# Patient Record
Sex: Female | Born: 1980 | Race: White | Hispanic: No | Marital: Married | Smoking: Former smoker
Health system: Southern US, Community
[De-identification: ages and names within clinical notes are randomized; demographics above are authoritative.]

## PROBLEM LIST (undated history)

## (undated) DIAGNOSIS — G4730 Sleep apnea, unspecified: Secondary | ICD-10-CM

## (undated) DIAGNOSIS — T7840XA Allergy, unspecified, initial encounter: Secondary | ICD-10-CM

## (undated) DIAGNOSIS — K219 Gastro-esophageal reflux disease without esophagitis: Secondary | ICD-10-CM

## (undated) DIAGNOSIS — T8859XA Other complications of anesthesia, initial encounter: Secondary | ICD-10-CM

## (undated) DIAGNOSIS — R011 Cardiac murmur, unspecified: Secondary | ICD-10-CM

## (undated) DIAGNOSIS — G709 Myoneural disorder, unspecified: Secondary | ICD-10-CM

## (undated) DIAGNOSIS — F32A Depression, unspecified: Secondary | ICD-10-CM

## (undated) DIAGNOSIS — F419 Anxiety disorder, unspecified: Secondary | ICD-10-CM

## (undated) DIAGNOSIS — I10 Essential (primary) hypertension: Secondary | ICD-10-CM

## (undated) HISTORY — DX: Cardiac murmur, unspecified: R01.1

## (undated) HISTORY — DX: Anxiety disorder, unspecified: F41.9

## (undated) HISTORY — DX: Allergy, unspecified, initial encounter: T78.40XA

## (undated) HISTORY — DX: Essential (primary) hypertension: I10

## (undated) HISTORY — DX: Myoneural disorder, unspecified: G70.9

## (undated) HISTORY — PX: ABDOMINAL HYSTERECTOMY: SHX81

## (undated) HISTORY — DX: Sleep apnea, unspecified: G47.30

## (undated) HISTORY — DX: Depression, unspecified: F32.A

## (undated) HISTORY — DX: Gastro-esophageal reflux disease without esophagitis: K21.9

---

## 2014-03-11 DIAGNOSIS — R011 Cardiac murmur, unspecified: Secondary | ICD-10-CM | POA: Insufficient documentation

## 2014-03-11 DIAGNOSIS — Z720 Tobacco use: Secondary | ICD-10-CM | POA: Insufficient documentation

## 2015-05-22 DIAGNOSIS — F3281 Premenstrual dysphoric disorder: Secondary | ICD-10-CM | POA: Insufficient documentation

## 2015-10-26 DIAGNOSIS — F321 Major depressive disorder, single episode, moderate: Secondary | ICD-10-CM | POA: Insufficient documentation

## 2015-11-24 DIAGNOSIS — M26609 Unspecified temporomandibular joint disorder, unspecified side: Secondary | ICD-10-CM | POA: Insufficient documentation

## 2016-06-20 DIAGNOSIS — D259 Leiomyoma of uterus, unspecified: Secondary | ICD-10-CM | POA: Insufficient documentation

## 2016-08-30 DIAGNOSIS — Z90710 Acquired absence of both cervix and uterus: Secondary | ICD-10-CM | POA: Insufficient documentation

## 2016-09-22 DIAGNOSIS — M79601 Pain in right arm: Secondary | ICD-10-CM | POA: Insufficient documentation

## 2018-02-03 DIAGNOSIS — E559 Vitamin D deficiency, unspecified: Secondary | ICD-10-CM | POA: Insufficient documentation

## 2018-02-03 DIAGNOSIS — Z0000 Encounter for general adult medical examination without abnormal findings: Secondary | ICD-10-CM | POA: Insufficient documentation

## 2019-11-11 DIAGNOSIS — R0789 Other chest pain: Secondary | ICD-10-CM | POA: Insufficient documentation

## 2020-03-21 DIAGNOSIS — F4001 Agoraphobia with panic disorder: Secondary | ICD-10-CM | POA: Insufficient documentation

## 2020-03-21 HISTORY — DX: Agoraphobia with panic disorder: F40.01

## 2020-11-24 ENCOUNTER — Ambulatory Visit: Payer: Self-pay | Admitting: Internal Medicine

## 2021-01-01 ENCOUNTER — Telehealth: Payer: Self-pay

## 2021-01-01 NOTE — Telephone Encounter (Signed)
Copied from [place] [id number]. Topic: General - Other >> Jan 01, 2021 10:08 AM Leward Quan A wrote: Reason for CRM: Patient called in to say that she was seen at ER due to severe pain say that she was informed to see her PCP in a few days. She have not yet been established and need some directions on what to do. Scheduled to establish care with [facility] on 01/21/21. Please call patient with some suggestion on what to do Ph# [phone]   The patient and her wife was informed that we do not have any sooner new patient appointments. The wife was very upset stating that the patient was in severe pain and have not had a bowel movement in 5 days. I recommended that she follow back up at the hospital if she is in severe pain. The wife stated she has been to the hospital and Urgent Care and nothing was done. I informed her that unfortunately there is nothing I can do at this time because she is not technically a patient of [facility]. I advise her again to seek emergent care if she is in severe pain.

## 2021-01-21 ENCOUNTER — Encounter: Payer: Self-pay | Admitting: Internal Medicine

## 2021-01-21 ENCOUNTER — Ambulatory Visit (INDEPENDENT_AMBULATORY_CARE_PROVIDER_SITE_OTHER): Payer: 59 | Admitting: Internal Medicine

## 2021-01-21 ENCOUNTER — Other Ambulatory Visit: Payer: Self-pay

## 2021-01-21 VITALS — BP 153/101 | HR 72 | Temp 97.1°F | Resp 18 | Ht 62.0 in | Wt 183.6 lb

## 2021-01-21 DIAGNOSIS — K5909 Other constipation: Secondary | ICD-10-CM

## 2021-01-21 DIAGNOSIS — L309 Dermatitis, unspecified: Secondary | ICD-10-CM | POA: Insufficient documentation

## 2021-01-21 DIAGNOSIS — K219 Gastro-esophageal reflux disease without esophagitis: Secondary | ICD-10-CM

## 2021-01-21 DIAGNOSIS — I10 Essential (primary) hypertension: Secondary | ICD-10-CM | POA: Diagnosis not present

## 2021-01-21 DIAGNOSIS — F900 Attention-deficit hyperactivity disorder, predominantly inattentive type: Secondary | ICD-10-CM | POA: Diagnosis not present

## 2021-01-21 DIAGNOSIS — L308 Other specified dermatitis: Secondary | ICD-10-CM

## 2021-01-21 DIAGNOSIS — Z6836 Body mass index (BMI) 36.0-36.9, adult: Secondary | ICD-10-CM | POA: Insufficient documentation

## 2021-01-21 DIAGNOSIS — F909 Attention-deficit hyperactivity disorder, unspecified type: Secondary | ICD-10-CM | POA: Insufficient documentation

## 2021-01-21 DIAGNOSIS — Z6833 Body mass index (BMI) 33.0-33.9, adult: Secondary | ICD-10-CM

## 2021-01-21 DIAGNOSIS — E6609 Other obesity due to excess calories: Secondary | ICD-10-CM

## 2021-01-21 DIAGNOSIS — M797 Fibromyalgia: Secondary | ICD-10-CM

## 2021-01-21 DIAGNOSIS — F32A Depression, unspecified: Secondary | ICD-10-CM

## 2021-01-21 DIAGNOSIS — F419 Anxiety disorder, unspecified: Secondary | ICD-10-CM | POA: Diagnosis not present

## 2021-01-21 DIAGNOSIS — F4310 Post-traumatic stress disorder, unspecified: Secondary | ICD-10-CM

## 2021-01-21 MED ORDER — OLMESARTAN MEDOXOMIL 20 MG PO TABS: 20.0000 mg | ORAL_TABLET | Freq: Every day | ORAL | 0 refills | Status: DC

## 2021-01-21 MED ORDER — CYCLOBENZAPRINE HCL 5 MG PO TABS: 5.0000 mg | ORAL_TABLET | Freq: Every evening | ORAL | 0 refills | Status: DC | PRN

## 2021-01-21 MED ORDER — TRIAMCINOLONE ACETONIDE 0.1 % EX CREA: 1.0000 "application " | TOPICAL_CREAM | Freq: Two times a day (BID) | CUTANEOUS | 0 refills | Status: DC

## 2021-01-21 MED ORDER — OMEPRAZOLE 20 MG PO CPDR: 20.0000 mg | DELAYED_RELEASE_CAPSULE | Freq: Every day | ORAL | 1 refills | Status: DC

## 2021-01-21 NOTE — Assessment & Plan Note (Signed)
Encourage diet and exercise for weight loss 

## 2021-01-21 NOTE — Assessment & Plan Note (Signed)
Managed on Fluvoxamine, Rexulti and Prazosin per psychiatry Will follow

## 2021-01-21 NOTE — Assessment & Plan Note (Signed)
Encouraged high-fiber diet and adequate water intake Referral to GI for further evaluation of symptoms

## 2021-01-21 NOTE — Assessment & Plan Note (Signed)
We will trial Omeprazole 20 mg daily Encouraged weight loss as this can help reduce reflux symptoms

## 2021-01-21 NOTE — Assessment & Plan Note (Signed)
Managed on Strattera but this is in the process of being changed by her psychiatrist Will follow

## 2021-01-21 NOTE — Patient Instructions (Signed)

## 2021-01-21 NOTE — Assessment & Plan Note (Signed)
Rx for triamcinolone cream 0.1% twice daily as needed

## 2021-01-21 NOTE — Assessment & Plan Note (Signed)
Rx for Olmesartan 20 mg daily Reinforced DASH diet and exercise for weight loss We will check c-Met at annual exam

## 2021-01-21 NOTE — Progress Notes (Signed)
HPI  Patient presents the clinic today to establish care and for management of the conditions listed below.  HTN: She has failed Lisinopril, Losartan and Amlodipine. She denies frequent headaches, dizziness or chest pain.  ADHD: Managed on Straterra.  She reports this medicine has not been effective recently and is in the process of being changed.  She follows with psychiatrist.  Anxiety, Depression, PTSD, and Nightmares: Chronic, managed on Prazosin, Fluvoxamine and Rexulti.  She is currently seeing a therapist.  She is seeing a psychiatrist also.  She denies SI/HI.  Chronic Constipation: Managed on Mirilax. She reports recent changes in medication but not in diet.  She has had a recent urgent care and ER visit for the same.  She has not seen a gastroenterologist but would like a referral to them.Marland Kitchen   GERD: Triggered by spicy and greasy foods, stress. This occurs daily. She takes Tums as needed with some relief of symptoms. She has never had an upper endoscopy.  Fibromyalgia: She reports SI joint, piriformis, rhomboids. She is not currently taking any thing for this. She does get massages with some relief. She does stretch but is not lifting light weights.  Eczema: She is using Eucerin as needed with some relief of symptoms.    Past Medical History:  Diagnosis Date   Anxiety    Chronic kidney disease    Depression    GERD (gastroesophageal reflux disease)    Hypertension     Current Outpatient Medications  Medication Sig Dispense Refill   atomoxetine (STRATTERA) 40 MG capsule Take 40 mg by mouth every morning.     Brexpiprazole (REXULTI) 0.5 MG TABS Take 1 tablet by mouth daily.     clonazePAM (KLONOPIN) 0.5 MG tablet Take 0.5 mg by mouth daily as needed.     fluvoxaMINE (LUVOX) 100 MG tablet Take 100 mg by mouth 2 (two) times daily.     prazosin (MINIPRESS) 5 MG capsule Take 5 mg by mouth at bedtime.     No current facility-administered medications for this visit.    Allergies   Allergen Reactions   Bee Venom Rash   Lactose Diarrhea    Gas    Prednisone     Other reaction(s): Unknown   Sertraline Diarrhea    Other reaction(s): Other (See Comments), Other (See Comments) Emotional numbness Emotional numbness    Lisinopril Cough and Other (See Comments)    cough cough     No family history on file.  Social History   Socioeconomic History   Marital status: Married    Spouse name: Not on file   Number of children: Not on file   Years of education: Not on file   Highest education level: Not on file  Occupational History   Not on file  Tobacco Use   Smoking status: Not on file   Smokeless tobacco: Not on file  Substance and Sexual Activity   Alcohol use: Yes   Drug use: Never   Sexual activity: Not on file  Other Topics Concern   Not on file  Social History Narrative   Not on file   Social Determinants of Health   Financial Resource Strain: Not on file  Food Insecurity: Not on file  Transportation Needs: Not on file  Physical Activity: Not on file  Stress: Not on file  Social Connections: Not on file  Intimate Partner Violence: Not on file    ROS:  Constitutional: Denies fever, malaise, fatigue, headache or abrupt weight changes.  HEENT:  Denies eye pain, eye redness, ear pain, ringing in the ears, wax buildup, runny nose, nasal congestion, bloody nose, or sore throat. Respiratory: Denies difficulty breathing, shortness of breath, cough or sputum production.   Cardiovascular: Denies chest pain, chest tightness, palpitations or swelling in the hands or feet.  Gastrointestinal: Patient reports constipation.  Denies abdominal pain, bloating, diarrhea or blood in the stool.  GU: Denies frequency, urgency, pain with urination, blood in urine, odor or discharge. Musculoskeletal: Denies decrease in range of motion, difficulty with gait, muscle pain or joint pain and swelling.  Skin: Patient reports eczema.  Denies redness, lesions or  ulcercations.  Neurological: Patient reports inattention.  Denies dizziness, difficulty with memory, difficulty with speech or problems with balance and coordination.  Psych: Patient has a history of anxiety and depression.  Denies SI/HI.  No other specific complaints in a complete review of systems (except as listed in HPI above).  PE:  BP (!) 153/101 (BP Location: Right Arm, Patient Position: Sitting, Cuff Size: Normal)   Pulse 72   Temp (!) 97.1 F (36.2 C) (Temporal)   Resp 18   Ht 5\' 2"  (1.575 m)   Wt 183 lb 9.6 oz (83.3 kg)   SpO2 99%   BMI 33.58 kg/m  Wt Readings from Last 3 Encounters:  01/21/21 183 lb 9.6 oz (83.3 kg)    General: Appears her stated age, obese, in NAD. Skin: Patches of eczema noted of left medial thigh, right posterior knee HEENT: Head: normal shape and size; Eyes: sclera white and EOMs intact;  Neck: Neck supple, trachea midline. No masses, lumps or thyromegaly present.  Cardiovascular: Normal rate and rhythm. S1,S2 noted.  No murmur, rubs or gallops noted. No JVD or BLE edema.  Pulmonary/Chest: Normal effort and positive vesicular breath sounds. No respiratory distress. No wheezes, rales or ronchi noted.  Abdomen: Soft and nontender.  Hypoactive bowel sounds, no bruits noted. No distention or masses noted. Liver, spleen and kidneys non palpable. Musculoskeletal: Multiple tender spots noted of the paraspinal muscles of the cervical, thoracic and lumbar region no difficulty with gait.  Neurological: Alert and oriented.  Psychiatric: Mood and affect normal.  Mildly anxious appearing.  Judgment and thought content normal.     Assessment and Plan:  Webb Silversmith, NP This visit occurred during the SARS-CoV-2 public health emergency.  Safety protocols were in place, including screening questions prior to the visit, additional usage of staff PPE, and extensive cleaning of exam room while observing appropriate contact time as indicated for disinfecting  solutions.

## 2021-01-21 NOTE — Assessment & Plan Note (Signed)
Encouraged daily stretching and light weight training Rx for Flexeril 5 mg nightly as needed for flares-sedation caution given

## 2021-01-25 ENCOUNTER — Encounter: Payer: Self-pay | Admitting: *Deleted

## 2021-02-04 ENCOUNTER — Other Ambulatory Visit: Payer: Self-pay

## 2021-02-04 ENCOUNTER — Encounter: Payer: Self-pay | Admitting: Internal Medicine

## 2021-02-04 ENCOUNTER — Ambulatory Visit (INDEPENDENT_AMBULATORY_CARE_PROVIDER_SITE_OTHER): Payer: 59 | Admitting: Internal Medicine

## 2021-02-04 ENCOUNTER — Ambulatory Visit
Admission: RE | Admit: 2021-02-04 | Discharge: 2021-02-04 | Disposition: A | Discharge status: Home / Self Care | Payer: 59 | Attending: Internal Medicine | Admitting: Internal Medicine

## 2021-02-04 ENCOUNTER — Ambulatory Visit
Admission: RE | Admit: 2021-02-04 | Discharge: 2021-02-04 | Disposition: A | Discharge status: Home / Self Care | Payer: 59 | Source: Ambulatory Visit | Attending: Internal Medicine | Admitting: Internal Medicine

## 2021-02-04 VITALS — BP 118/78 | HR 82 | Temp 97.1°F | Resp 17 | Ht 62.0 in | Wt 192.2 lb

## 2021-02-04 DIAGNOSIS — M545 Low back pain, unspecified: Secondary | ICD-10-CM

## 2021-02-04 DIAGNOSIS — I10 Essential (primary) hypertension: Secondary | ICD-10-CM | POA: Diagnosis not present

## 2021-02-04 DIAGNOSIS — N62 Hypertrophy of breast: Secondary | ICD-10-CM

## 2021-02-04 DIAGNOSIS — G8929 Other chronic pain: Secondary | ICD-10-CM | POA: Diagnosis present

## 2021-02-04 DIAGNOSIS — K219 Gastro-esophageal reflux disease without esophagitis: Secondary | ICD-10-CM

## 2021-02-04 DIAGNOSIS — Z6835 Body mass index (BMI) 35.0-35.9, adult: Secondary | ICD-10-CM

## 2021-02-04 DIAGNOSIS — M546 Pain in thoracic spine: Secondary | ICD-10-CM

## 2021-02-04 MED ORDER — OLMESARTAN MEDOXOMIL 20 MG PO TABS: 20.0000 mg | ORAL_TABLET | Freq: Every day | ORAL | 1 refills | Status: DC

## 2021-02-04 NOTE — Patient Instructions (Signed)
Back Exercises The following exercises strengthen the muscles that help to support the trunk and back. They also help to keep the lower back flexible. Doing these exercises can help to prevent back pain or lessen existing pain. If you have back pain or discomfort, try doing these exercises 2-3 times each day or as told by your health care provider. As your pain improves, do them once each day, but increase the number of times that you repeat the steps for each exercise (do more repetitions). To prevent the recurrence of back pain, continue to do these exercises once each day or as told by your health care provider. Do exercises exactly as told by your health care provider and adjust them as directed. It is normal to feel mild stretching, pulling, tightness, or discomfort as you do these exercises, but you should stop right away if youfeel sudden pain or your pain gets worse. Exercises Single knee to chest Repeat these steps 3-5 times for each leg: Lie on your back on a firm bed or the floor with your legs extended. Bring one knee to your chest. Your other leg should stay extended and in contact with the floor. Hold your knee in place by grabbing your knee or thigh with both hands and hold. Pull on your knee until you feel a gentle stretch in your lower back or buttocks. Hold the stretch for 10-30 seconds. Slowly release and straighten your leg. Pelvic tilt Repeat these steps 5-10 times: Lie on your back on a firm bed or the floor with your legs extended. Bend your knees so they are pointing toward the ceiling and your feet are flat on the floor. Tighten your lower abdominal muscles to press your lower back against the floor. This motion will tilt your pelvis so your tailbone points up toward the ceiling instead of pointing to your feet or the floor. With gentle tension and even breathing, hold this position for 5-10 seconds. Cat-cow Repeat these steps until your lower back becomes more  flexible: Get into a hands-and-knees position on a firm surface. Keep your hands under your shoulders, and keep your knees under your hips. You may place padding under your knees for comfort. Let your head hang down toward your chest. Contract your abdominal muscles and point your tailbone toward the floor so your lower back becomes rounded like the back of a cat. Hold this position for 5 seconds. Slowly lift your head, let your abdominal muscles relax and point your tailbone up toward the ceiling so your back forms a sagging arch like the back of a cow. Hold this position for 5 seconds.  Press-ups Repeat these steps 5-10 times: Lie on your abdomen (face-down) on the floor. Place your palms near your head, about shoulder-width apart. Keeping your back as relaxed as possible and keeping your hips on the floor, slowly straighten your arms to raise the top half of your body and lift your shoulders. Do not use your back muscles to raise your upper torso. You may adjust the placement of your hands to make yourself more comfortable. Hold this position for 5 seconds while you keep your back relaxed. Slowly return to lying flat on the floor.  Bridges Repeat these steps 10 times: Lie on your back on a firm surface. Bend your knees so they are pointing toward the ceiling and your feet are flat on the floor. Your arms should be flat at your sides, next to your body. Tighten your buttocks muscles and lift your   buttocks off the floor until your waist is at almost the same height as your knees. You should feel the muscles working in your buttocks and the back of your thighs. If you do not feel these muscles, slide your feet 1-2 inches farther away from your buttocks. Hold this position for 3-5 seconds. Slowly lower your hips to the starting position, and allow your buttocks muscles to relax completely. If this exercise is too easy, try doing it with your arms crossed over yourchest. Abdominal  crunches Repeat these steps 5-10 times: Lie on your back on a firm bed or the floor with your legs extended. Bend your knees so they are pointing toward the ceiling and your feet are flat on the floor. Cross your arms over your chest. Tip your chin slightly toward your chest without bending your neck. Tighten your abdominal muscles and slowly raise your trunk (torso) high enough to lift your shoulder blades a tiny bit off the floor. Avoid raising your torso higher than that because it can put too much stress on your low back and does not help to strengthen your abdominal muscles. Slowly return to your starting position. Back lifts Repeat these steps 5-10 times: Lie on your abdomen (face-down) with your arms at your sides, and rest your forehead on the floor. Tighten the muscles in your legs and your buttocks. Slowly lift your chest off the floor while you keep your hips pressed to the floor. Keep the back of your head in line with the curve in your back. Your eyes should be looking at the floor. Hold this position for 3-5 seconds. Slowly return to your starting position. Contact a health care provider if: Your back pain or discomfort gets much worse when you do an exercise. Your worsening back pain or discomfort does not lessen within 2 hours after you exercise. If you have any of these problems, stop doing these exercises right away. Do not do them again unless your health care provider says that you can. Get help right away if: You develop sudden, severe back pain. If this happens, stop doing the exercises right away. Do not do them again unless your health care provider says that you can. This information is not intended to replace advice given to you by your health care provider. Make sure you discuss any questions you have with your healthcare provider. Document Revised: 11/08/2018 Document Reviewed: 04/05/2018 Elsevier Patient Education  [place].

## 2021-02-04 NOTE — Progress Notes (Signed)
Subjective:    Patient ID: Sara Andrade, female    DOB: 04/15/1981, 40 y.o.   MRN: [id number]  HPI  Pt presents to the clinic today for follow up of HTN. At her last visit, she was started on Olmesartan.  She has been taking the medication as prescribed and denies adverse side effects.  Her BP today is 118/78.  There is no ECG on file.  She was also started on Omeprazole for reflux.  She reports the reflux still persists intermittently but overall is much better on the Omeprazole.  She also reports chronic thoracic back pain.  She reports this has been going on for many years.  She attributes this to her large breasts which are currently a size 36 GGG.  She reports indentions in her shoulders from her bra.  She reports chronic rash underneath her bilateral breasts.  She has considered getting a breast reduction but is not quite ready for this at this time.  She takes Flexeril as needed with some relief of symptoms.  She also reports chronic low back pain.  She describes the pain as achy and stiff.  The pain radiates around into her bilateral hip/groins.  She denies numbness, tingling or weakness of her lower extremities.  She denies loss of bowel or bladder control.  She takes Flexeril as needed with some relief of symptoms.  Review of Systems  Past Medical History:  Diagnosis Date   Anxiety    Depression    GERD (gastroesophageal reflux disease)    Hypertension     Current Outpatient Medications  Medication Sig Dispense Refill   Brexpiprazole (REXULTI) 0.5 MG TABS Take 1 tablet by mouth daily.     clonazePAM (KLONOPIN) 0.5 MG tablet Take 0.5 mg by mouth daily as needed.     cyclobenzaprine (FLEXERIL) 5 MG tablet Take 1 tablet (5 mg total) by mouth at bedtime as needed for muscle spasms. 20 tablet 0   fluvoxaMINE (LUVOX) 100 MG tablet Take 100 mg by mouth 2 (two) times daily.     olmesartan (BENICAR) 20 MG tablet Take 1 tablet (20 mg total) by mouth daily. 30 tablet 0   omeprazole  (PRILOSEC) 20 MG capsule Take 1 capsule (20 mg total) by mouth daily. 90 capsule 1   prazosin (MINIPRESS) 5 MG capsule Take 5 mg by mouth at bedtime.     triamcinolone cream (KENALOG) 0.1 % Apply 1 application topically 2 (two) times daily. 60 g 0   atomoxetine (STRATTERA) 40 MG capsule Take 40 mg by mouth every morning. (Patient not taking: Reported on 02/04/2021)     No current facility-administered medications for this visit.    Allergies  Allergen Reactions   Bee Venom Rash   Lactose Diarrhea    Gas    Prednisone     Other reaction(s): Unknown   Sertraline Diarrhea    Other reaction(s): Other (See Comments), Other (See Comments) Emotional numbness Emotional numbness    Lisinopril Cough and Other (See Comments)    cough cough     No family history on file.  Social History   Socioeconomic History   Marital status: Married    Spouse name: Not on file   Number of children: Not on file   Years of education: Not on file   Highest education level: Not on file  Occupational History   Not on file  Tobacco Use   Smoking status: Former    Types: Cigarettes    Quit date: 2012  Years since quitting: 10.5   Smokeless tobacco: Never  Vaping Use   Vaping Use: Never used  Substance and Sexual Activity   Alcohol use: Yes   Drug use: Never   Sexual activity: Not on file  Other Topics Concern   Not on file  Social History Narrative   Not on file   Social Determinants of Health   Financial Resource Strain: Not on file  Food Insecurity: Not on file  Transportation Needs: Not on file  Physical Activity: Not on file  Stress: Not on file  Social Connections: Not on file  Intimate Partner Violence: Not on file     Constitutional: Denies fever, malaise, fatigue, headache or abrupt weight changes.  Respiratory: Denies difficulty breathing, shortness of breath, cough or sputum production.   Cardiovascular: Denies chest pain, chest tightness, palpitations or swelling in  the hands or feet.  Gastrointestinal: Denies abdominal pain, bloating, constipation, diarrhea or blood in the stool.  GU: Denies urgency, frequency, pain with urination, burning sensation, blood in urine, odor or discharge. Musculoskeletal: Patient reports chronic thoracic and lumbar back pain.  Denies decrease in range of motion, difficulty with gait, or joint swelling.  Skin: Denies redness, rashes, lesions or ulcercations.  Neurological: Denies numbness, tingling, weakness or problems with balance and coordination.    No other specific complaints in a complete review of systems (except as listed in HPI above).     Objective:   Physical Exam  BP 118/78 (BP Location: Right Arm, Patient Position: Sitting, Cuff Size: Normal)   Pulse 82   Temp (!) 97.1 F (36.2 C) (Temporal)   Resp 17   Ht 5\' 2"  (1.575 m)   Wt 192 lb 3.2 oz (87.2 kg)   SpO2 99%   BMI 35.15 kg/m  Wt Readings from Last 3 Encounters:  02/04/21 192 lb 3.2 oz (87.2 kg)  01/21/21 183 lb 9.6 oz (83.3 kg)    General: Appears her stated age, obese, in NAD. Skin: Warm, dry and intact.  Large breasts noted.  Intention noted in bilateral shoulders from bra strap. HEENT: Head: normal shape and size; Eyes: sclera white and EOMs intact;  Cardiovascular: Normal rate and rhythm. S1,S2 noted.  No murmur, rubs or gallops noted. No JVD or BLE edema.  Pulmonary/Chest: Normal effort and positive vesicular breath sounds. No respiratory distress. No wheezes, rales or ronchi noted.  Musculoskeletal: Bony tenderness noted over the thoracic and lumbar spine.  No difficulty with gait.  Neurological: Alert and oriented.       Assessment & Plan:    Webb Silversmith, NP This visit occurred during the SARS-CoV-2 public health emergency.  Safety protocols were in place, including screening questions prior to the visit, additional usage of staff PPE, and extensive cleaning of exam room while observing appropriate contact time as indicated for  disinfecting solutions.

## 2021-02-04 NOTE — Assessment & Plan Note (Signed)
Controlled on Olmesartan, refilled today Reinforced DASH diet and exercise for weight loss Kidney function reviewed

## 2021-02-04 NOTE — Assessment & Plan Note (Addendum)
X-ray thoracic spine today Encouraged daily stretching Discussed increasing Flexeril to 10 mg daily but she thinks this would be too sedating

## 2021-02-04 NOTE — Assessment & Plan Note (Signed)
X-ray lumbar spine today Encouraged daily stretching Discussed increasing Flexeril to 10 mg daily but she thinks this would be too sedating

## 2021-02-04 NOTE — Assessment & Plan Note (Signed)
Continue Omeprazole Try to avoid foods that trigger your reflux Encourage weight loss as this can produce reflux symptoms

## 2021-02-04 NOTE — Assessment & Plan Note (Signed)
X-ray thoracic spine today Would consider referral for breast reduction

## 2021-02-04 NOTE — Assessment & Plan Note (Signed)
Encourage diet and exercise weight loss 

## 2021-03-05 ENCOUNTER — Other Ambulatory Visit: Payer: Self-pay | Admitting: Internal Medicine

## 2021-03-05 NOTE — Telephone Encounter (Signed)
Requested medications are due for refill today yes  Requested medications are on the active medication list yes  Last refill 7/7  Last visit 7/21  Future visit scheduled 07/28/21  Notes to clinic Not Delegated.

## 2021-04-14 ENCOUNTER — Ambulatory Visit: Payer: 59 | Admitting: Internal Medicine

## 2021-04-20 ENCOUNTER — Other Ambulatory Visit: Payer: Self-pay

## 2021-04-21 ENCOUNTER — Other Ambulatory Visit: Payer: Self-pay

## 2021-04-21 ENCOUNTER — Encounter: Payer: Self-pay | Admitting: Gastroenterology

## 2021-04-21 ENCOUNTER — Ambulatory Visit (INDEPENDENT_AMBULATORY_CARE_PROVIDER_SITE_OTHER): Payer: 59 | Admitting: Gastroenterology

## 2021-04-21 VITALS — BP 162/117 | HR 75 | Temp 98.4°F | Ht 62.0 in | Wt 193.6 lb

## 2021-04-21 DIAGNOSIS — K625 Hemorrhage of anus and rectum: Secondary | ICD-10-CM

## 2021-04-21 DIAGNOSIS — K581 Irritable bowel syndrome with constipation: Secondary | ICD-10-CM | POA: Diagnosis not present

## 2021-04-21 DIAGNOSIS — L290 Pruritus ani: Secondary | ICD-10-CM | POA: Diagnosis not present

## 2021-04-21 MED ORDER — NA SULFATE-K SULFATE-MG SULF 17.5-3.13-1.6 GM/177ML PO SOLN: 354.0000 mL | Freq: Once | ORAL | 0 refills | Status: AC

## 2021-04-21 NOTE — Patient Instructions (Signed)

## 2021-04-21 NOTE — Progress Notes (Signed)
Jonathon Bellows MD, MRCP(U.K) [address]  [place]  [place], [place] [address]  Main: [phone]  Fax: [phone]   Gastroenterology Consultation  Referring Provider:     Jearld Fenton, NP Primary Care Physician:  Jearld Fenton, NP Primary Gastroenterologist:  Dr. Jonathon Bellows  Reason for Consultation:     Chronic constipation  HPI:   Sara Andrade is a 40 y.o. y/o female referred for consultation & management  by  Jearld Fenton, NP.   Seen in the ER on 01/09/2021 for constipation she has history of mitral valve prolapse, endometriosis.  She underwent a CT scan of the abdomen and pelvis without evidence of any abnormalities was given a gallon of GoLytely and sent home. 01/09/2021: Hemoglobin 13.5 g, CMP normal  She states that she has had issues with constipation for many years gradually got worse.  She has abdominal pain in the left side cramping in nature almost daily which is resolved after bowel movement.  She has a bowel movement daily but does not feel like she has evacuated her entire bowel contents.  Has not tried over-the-counter medications.  Diet not rich in fiber.  Has had some blood on the tissue paper over the years.  Some perianal itching.  No family history available since she is adopted.  No prior colonoscopy.  Past Medical History:  Diagnosis Date   Anxiety    Depression    GERD (gastroesophageal reflux disease)    Hypertension     Past Surgical History:  Procedure Laterality Date   ABDOMINAL HYSTERECTOMY      Prior to Admission medications   Medication Sig Start Date End Date Taking? Authorizing Provider  CONCERTA 27 MG CR tablet Take 27 mg by mouth every morning. 04/15/21  Yes [provider]  fluvoxaMINE (LUVOX) 100 MG tablet Take 100 mg by mouth 2 (two) times daily. 12/15/20  Yes [provider]  olmesartan (BENICAR) 20 MG tablet Take 1 tablet (20 mg total) by mouth daily. 02/04/21  Yes Jearld Fenton, NP  omeprazole  (PRILOSEC) 20 MG capsule Take 1 capsule (20 mg total) by mouth daily. 01/21/21  Yes Baity, Coralie Keens, NP  prazosin (MINIPRESS) 5 MG capsule Take 5 mg by mouth at bedtime. 01/14/21  Yes [provider]  REXULTI 1 MG TABS tablet Take 1 mg by mouth daily. 03/24/21  Yes [provider]  clonazePAM (KLONOPIN) 0.5 MG tablet Take 0.5 mg by mouth daily as needed. Patient not taking: Reported on 04/21/2021 01/14/21   [provider]  cyclobenzaprine (FLEXERIL) 5 MG tablet TAKE ONE TABLET BY MOUTH EVERY NIGHT AT BEDTIME AS NEEDED FOR MUSCLE SPASMS Patient not taking: Reported on 04/21/2021 03/08/21   Jearld Fenton, NP  triamcinolone cream (KENALOG) 0.1 % Apply 1 application topically 2 (two) times daily. Patient not taking: Reported on 04/21/2021 01/21/21   Jearld Fenton, NP    No family history on file.   Social History   Tobacco Use   Smoking status: Former    Types: Cigarettes    Quit date: 2012    Years since quitting: 10.7   Smokeless tobacco: Never  Vaping Use   Vaping Use: Never used  Substance Use Topics   Alcohol use: Yes   Drug use: Never    Allergies as of 04/21/2021 - Review Complete 02/04/2021  Allergen Reaction Noted   Bee venom Rash 01/21/2021   Lactose Diarrhea 06/04/2015   Prednisone  12/25/2020   Sertraline  Diarrhea 08/25/2015   Lisinopril Cough and Other (See Comments) 05/22/2015    Review of Systems:    All systems reviewed and negative except where noted in HPI.   Physical Exam:  BP (!) 162/117   Pulse 75   Temp 98.4 F (36.9 C) (Oral)   Ht 5\' 2"  (1.575 m)   Wt 193 lb 9.6 oz (87.8 kg)   BMI 35.41 kg/m  No LMP recorded. Patient has had a hysterectomy. Psych:  Alert and cooperative. Normal mood and affect. General:   Alert,  Well-developed, well-nourished, pleasant and cooperative in NAD Head:  Normocephalic and atraumatic. Eyes:  Sclera clear, no icterus.   Conjunctiva pink. Ears:  Normal auditory acuity. Lungs:  Respirations even and  unlabored.  Clear throughout to auscultation.   No wheezes, crackles, or rhonchi. No acute distress. Heart:  Regular rate and rhythm; no murmurs, clicks, rubs, or gallops. Abdomen:  Normal bowel sounds.  No bruits.  Soft, non-tender and non-distended without masses, hepatosplenomegaly or hernias noted.  No guarding or rebound tenderness.    Neurologic:  Alert and oriented x3;  grossly normal neurologically. Psych:  Alert and cooperative. Normal mood and affect.  Imaging Studies: No results found.  Assessment and Plan:   [name] is a 40 y.o. y/o female has been referred for chronic constipation.  History suggestive of IBS constipation.  Rectal bleeding likely secondary to constipation.  History of endometriosis.  Plan 1.  High-fiber diet counseled about aim of 25 g of fiber per day.  Provided [facility] information on high-fiber diet. 2.  Internal hemorrhoids likely which will be confirmed on colonoscopy.  Discussed about conservative management.  Patient information provided.  If it does not work at next visit we will consider banding of internal hemorrhoids. 3.  Colonoscopy to evaluate for rectal bleeding likely due to internal hemorrhoids.  We will also rule out any colonic endometriomas at the same time. 4.  Constipation high-fiber diet we will add Trulance if needed.  Samples provided.  I have discussed alternative options, risks & benefits,  which include, but are not limited to, bleeding, infection, perforation,respiratory complication & drug reaction.  The patient agrees with this plan & written consent will be obtained.     Follow up in 8-12 weeks   Dr Jonathon Bellows MD,MRCP(U.K)

## 2021-04-27 ENCOUNTER — Telehealth: Payer: Self-pay

## 2021-04-27 NOTE — Telephone Encounter (Signed)
Procedure has been cancelled. Endo unit notified.

## 2021-04-27 NOTE — Telephone Encounter (Signed)
Pt. Calling to cancel colonoscopy she is showing covid symptoms

## 2021-04-28 ENCOUNTER — Encounter: Admission: RE | Payer: Self-pay | Source: Home / Self Care

## 2021-04-28 ENCOUNTER — Ambulatory Visit: Admission: RE | Admit: 2021-04-28 | Payer: 59 | Source: Home / Self Care | Admitting: Gastroenterology

## 2021-04-28 SURGERY — COLONOSCOPY
Anesthesia: General

## 2021-06-22 DIAGNOSIS — F419 Anxiety disorder, unspecified: Secondary | ICD-10-CM | POA: Insufficient documentation

## 2021-06-23 ENCOUNTER — Ambulatory Visit: Payer: 59 | Admitting: Gastroenterology

## 2021-06-23 ENCOUNTER — Ambulatory Visit (INDEPENDENT_AMBULATORY_CARE_PROVIDER_SITE_OTHER): Payer: 59 | Admitting: Internal Medicine

## 2021-06-23 ENCOUNTER — Other Ambulatory Visit: Payer: Self-pay

## 2021-06-23 ENCOUNTER — Encounter: Payer: Self-pay | Admitting: Internal Medicine

## 2021-06-23 VITALS — BP 116/62 | HR 78 | Resp 18 | Ht 62.0 in | Wt 197.0 lb

## 2021-06-23 DIAGNOSIS — M25551 Pain in right hip: Secondary | ICD-10-CM

## 2021-06-23 DIAGNOSIS — M5441 Lumbago with sciatica, right side: Secondary | ICD-10-CM

## 2021-06-23 DIAGNOSIS — Z23 Encounter for immunization: Secondary | ICD-10-CM

## 2021-06-23 DIAGNOSIS — M25552 Pain in left hip: Secondary | ICD-10-CM

## 2021-06-23 DIAGNOSIS — L301 Dyshidrosis [pompholyx]: Secondary | ICD-10-CM | POA: Diagnosis not present

## 2021-06-23 DIAGNOSIS — G8929 Other chronic pain: Secondary | ICD-10-CM

## 2021-06-23 DIAGNOSIS — M25511 Pain in right shoulder: Secondary | ICD-10-CM | POA: Diagnosis not present

## 2021-06-23 DIAGNOSIS — R7982 Elevated C-reactive protein (CRP): Secondary | ICD-10-CM

## 2021-06-23 DIAGNOSIS — M25512 Pain in left shoulder: Secondary | ICD-10-CM

## 2021-06-23 MED ORDER — MOMETASONE FUROATE 0.1 % EX OINT: TOPICAL_OINTMENT | Freq: Every day | CUTANEOUS | 0 refills | Status: DC

## 2021-06-23 NOTE — Patient Instructions (Signed)
Polymyalgia Rheumatica Polymyalgia rheumatica (PMR) is an inflammatory disorder that causes the muscles and joints to ache and become stiff. Sometimes, PMR leads to a more dangerous condition that can cause vision loss (temporal arteritis or giant cell arteritis). What are the causes? The exact cause of PMR is not known. What increases the risk? You are more likely to develop this condition if you are: Female. 42 years old or older. Of [place] European descent. What are the signs or symptoms? Pain and stiffness are the main symptoms of PMR and affect both sides of the body. These symptoms: May be worse after inactivity and in the morning. May affect your: Hips, buttocks, and thighs. Neck, arms, and shoulders. This can make it hard to raise your arms above your head. Hands and wrists. Other symptoms include: Fever. Tiredness. Weakness. Depression. Decreased appetite. This may lead to weight loss. Symptoms could start slowly but often come on suddenly, sometimes even overnight, or over a few days. How is this diagnosed? This condition is diagnosed with your medical history and a physical exam. You may need to see a health care provider who specializes in diseases of the joints, muscles, and bones (rheumatologist). You may also have tests, including: Blood tests. Often, test results that show inflammation are very elevated. Imaging studies like X-rays or ultrasound, which may be done to rule out other conditions. These are often usual in PMR. How is this treated? PMR usually goes away without treatment, but it may take years. PMR often responds rapidly (within a few days) to low-dose glucocorticoids (steroids). These medicines have serious side effects. Once your symptoms are better controlled, the dose should be lowered to find the lowest possible dose that controls your symptoms. Regular exercise and rest will also help your symptoms. Follow these instructions at home:  Take  over-the-counter and prescription medicines only as told by your health care provider. Make sure to get enough rest and sleep. Eat a healthy and nutritious diet that includes calcium and vitamin D. Calcium is important for bone health, and vitamin D helps your body absorb calcium. Good sources of calcium and vitamin D include: Certain fatty fish, such as salmon and tuna. Products that have calcium and vitamin D added to them (are fortified), such as fortified cereals. Collard greens, turnip greens, broccoli, and kale. Egg yolks. Cheese. Liver. Try to exercise most days of the week. Ask your health care provider what type of exercises are best for you. Keep all follow-up visits. This is important. Contact a health care provider if: Your symptoms do not improve with medicine. You have side effects from steroids. These may include: Weight gain. Swelling. Insomnia. Mood changes. Bruising. High blood sugar readings, if you have diabetes. Higher than normal blood pressure readings, if you monitor your blood pressure. Get help right away if: You develop symptoms of temporal arteritis, such as: A change in vision. Severe headache. Scalp pain. Jaw pain. These symptoms may represent a serious problem that is an emergency. Do not wait to see if the symptoms will go away. Get medical help right away. Call your local emergency services (911 in the [place] Do not drive yourself to the hospital. Summary Polymyalgia rheumatica is an inflammatory disorder that causes aching and stiffness in your muscles and joints. The exact cause of this condition is not known. This condition usually goes away without treatment. Your health care provider may give you low-dose glucocorticoids (steroids) to help manage your pain and stiffness. Rest and regular exercise will help  the symptoms. This information is not intended to replace advice given to you by your health care provider. Make sure you discuss any  questions you have with your health care provider. Document Revised: 11/05/2020 Document Reviewed: 11/05/2020 Elsevier Patient Education  2022 [employer].

## 2021-06-23 NOTE — Progress Notes (Signed)
Subjective:    Patient ID: Sara Andrade, female    DOB: February 15, 1981, 40 y.o.   MRN: [id number]  HPI  Patient presents the clinic today with complaint of bilateral shoulder and hip pain.  She reports this started 1 year ago.  She describes the pain as dull and achy.  She does have some sharp pain in the low back that radiates down her right leg.  She reports associated numbness and tingling of her right lower extremity.  Sitting or standing too long or laying on her right side makes the pain worse.  She denies loss of bowel or bladder control.  She does not take any medication for the pain.  She has no family history of autoimmune disease that she is aware of but she is adopted.  She also reports a rash to her her left hand.  She noticed this 2 to 3 months ago.  She reports the area is cracked, itches and burns.  She has not noticed any drainage from the area.  She has not put anything OTC for this.  Review of Systems     Past Medical History:  Diagnosis Date   Anxiety    Depression    GERD (gastroesophageal reflux disease)    Hypertension     Current Outpatient Medications  Medication Sig Dispense Refill   mometasone (ELOCON) 0.1 % ointment Apply topically daily. 45 g 0   clonazePAM (KLONOPIN) 0.5 MG tablet Take 0.5 mg by mouth daily as needed. (Patient not taking: Reported on 86/07/6835)     CONCERTA 27 MG CR tablet Take 27 mg by mouth every morning.     cyclobenzaprine (FLEXERIL) 5 MG tablet TAKE ONE TABLET BY MOUTH EVERY NIGHT AT BEDTIME AS NEEDED FOR MUSCLE SPASMS (Patient not taking: Reported on 04/21/2021) 20 tablet 0   fluvoxaMINE (LUVOX) 100 MG tablet Take 100 mg by mouth 2 (two) times daily.     olmesartan (BENICAR) 20 MG tablet Take 1 tablet (20 mg total) by mouth daily. 90 tablet 1   omeprazole (PRILOSEC) 20 MG capsule Take 1 capsule (20 mg total) by mouth daily. 90 capsule 1   prazosin (MINIPRESS) 5 MG capsule Take 5 mg by mouth at bedtime.     REXULTI 1 MG TABS tablet  Take 1 mg by mouth daily.     triamcinolone cream (KENALOG) 0.1 % Apply 1 application topically 2 (two) times daily. (Patient not taking: Reported on 04/21/2021) 60 g 0   No current facility-administered medications for this visit.    Allergies  Allergen Reactions   Bee Venom Rash   Lactose Diarrhea    Gas    Prednisone     Other reaction(s): Unknown   Sertraline Diarrhea    Other reaction(s): Other (See Comments), Other (See Comments) Emotional numbness Emotional numbness    Lisinopril Cough and Other (See Comments)    cough cough     No family history on file.  Social History   Socioeconomic History   Marital status: Married    Spouse name: Not on file   Number of children: Not on file   Years of education: Not on file   Highest education level: Not on file  Occupational History   Not on file  Tobacco Use   Smoking status: Former    Types: Cigarettes    Quit date: 2012    Years since quitting: 10.9   Smokeless tobacco: Never  Vaping Use   Vaping Use: Never used  Substance and  Sexual Activity   Alcohol use: Yes   Drug use: Never   Sexual activity: Not on file  Other Topics Concern   Not on file  Social History Narrative   Not on file   Social Determinants of Health   Financial Resource Strain: Not on file  Food Insecurity: Not on file  Transportation Needs: Not on file  Physical Activity: Not on file  Stress: Not on file  Social Connections: Not on file  Intimate Partner Violence: Not on file     Constitutional: Denies fever, malaise, fatigue, headache or abrupt weight changes.  Respiratory: Denies difficulty breathing, shortness of breath, cough or sputum production.   Cardiovascular: Denies chest pain, chest tightness, palpitations or swelling in the hands or feet.  Gastrointestinal: Denies abdominal pain, bloating, constipation, diarrhea or blood in the stool.  GU: Denies urgency, frequency, pain with urination, burning sensation, blood in  urine, odor or discharge. Musculoskeletal: Patient reports bilateral shoulder, hip and low back pain.  Denies decrease in range of motion, difficulty with gait, muscle pain or joint swelling.  Skin: Patient reports rash on left palm.  Denies redness or ulcercations.  Neurological: Patient reports numbness and tingling in her right lower extremity.  Denies dizziness, difficulty with memory, difficulty with speech or problems with balance and coordination.    No other specific complaints in a complete review of systems (except as listed in HPI above).  Objective:   Physical Exam  BP 116/62 (BP Location: Left Arm, Patient Position: Sitting, Cuff Size: Normal)   Pulse 78   Resp 18   Ht '5\' 2"'  (1.575 m)   Wt 197 lb (89.4 kg)   SpO2 99%   BMI 36.03 kg/m   Wt Readings from Last 3 Encounters:  04/21/21 193 lb 9.6 oz (87.8 kg)  02/04/21 192 lb 3.2 oz (87.2 kg)  01/21/21 183 lb 9.6 oz (83.3 kg)    General: Appears her stated age, obese, in NAD. Skin: Dyshidrotic eczema noted of left palm. Cardiovascular: Normal rate and rhythm. S1,S2 noted.  No murmur, rubs or gallops noted.  Pulmonary/Chest: Normal effort and positive vesicular breath sounds. No respiratory distress. No wheezes, rales or ronchi noted.  Musculoskeletal: Normal internal and external rotation of the shoulders.  Normal internal and external rotation of the hips.  Pain with palpation of the right anterior hip.  Pain with palpation over the lumbar spine.  Strength 5/5 BUE/BLE.  No difficulty with gait.  Neurological: Alert and oriented.  Psychiatric: Mood and affect normal. Behavior is normal. Judgment and thought content normal.       Assessment & Plan:   Bilateral Hip Pain, Bilateral Shoulder Pain, Chronic Low Back Pain with Right-Sided Sciatica:  We will check ANA, ESR, CRP and rheumatoid factor to rule out polymyalgia rheumatica Start Naproxen 220 mg twice daily OTC, consume with food Consider x-ray bilateral shoulders,  bilateral hips and lumbar spine pending labs  Dyshidrotic Eczema:  Rx for Mometasone ointment daily as needed Avoid excessive hot water  We will follow-up after labs with further recommendation and treatment plan  Webb Silversmith, NP This visit occurred during the SARS-CoV-2 public health emergency.  Safety protocols were in place, including screening questions prior to the visit, additional usage of staff PPE, and extensive cleaning of exam room while observing appropriate contact time as indicated for disinfecting solutions.

## 2021-06-24 ENCOUNTER — Other Ambulatory Visit: Payer: 59

## 2021-07-08 ENCOUNTER — Other Ambulatory Visit: Payer: Self-pay

## 2021-07-08 DIAGNOSIS — M25551 Pain in right hip: Secondary | ICD-10-CM

## 2021-07-08 DIAGNOSIS — G8929 Other chronic pain: Secondary | ICD-10-CM

## 2021-07-09 ENCOUNTER — Other Ambulatory Visit: Payer: 59

## 2021-07-09 ENCOUNTER — Other Ambulatory Visit: Payer: Self-pay

## 2021-07-12 LAB — C-REACTIVE PROTEIN: CRP: 34.9 mg/L — ABNORMAL HIGH (ref <8.0)

## 2021-07-12 LAB — ANA: Anti Nuclear Antibody (ANA): NEGATIVE

## 2021-07-12 LAB — RHEUMATOID FACTOR: Rheumatoid fact SerPl-aCnc: <14 IU/mL (ref <14)

## 2021-07-12 LAB — SEDIMENTATION RATE: Sed Rate: 11 mm/h (ref 0–20)

## 2021-07-13 ENCOUNTER — Ambulatory Visit (INDEPENDENT_AMBULATORY_CARE_PROVIDER_SITE_OTHER): Payer: 59 | Admitting: Internal Medicine

## 2021-07-13 ENCOUNTER — Encounter: Payer: Self-pay | Admitting: Internal Medicine

## 2021-07-13 ENCOUNTER — Other Ambulatory Visit: Payer: Self-pay

## 2021-07-13 VITALS — BP 137/76 | HR 80 | Temp 97.8°F | Resp 17 | Ht 62.0 in | Wt 198.0 lb

## 2021-07-13 DIAGNOSIS — R0681 Apnea, not elsewhere classified: Secondary | ICD-10-CM

## 2021-07-13 DIAGNOSIS — G4701 Insomnia due to medical condition: Secondary | ICD-10-CM

## 2021-07-13 DIAGNOSIS — R5382 Chronic fatigue, unspecified: Secondary | ICD-10-CM

## 2021-07-13 DIAGNOSIS — Z6836 Body mass index (BMI) 36.0-36.9, adult: Secondary | ICD-10-CM

## 2021-07-13 NOTE — Progress Notes (Signed)
Subjective:    Patient ID: Sara Andrade, female    DOB: [birth year], 39 y.o.   MRN: [id number]  HPI  Pt presents to the clinic today with c/o fatigue. She reports this is a chronic issue. She reports she does not sleep well. She has difficulty falling and staying asleep. She does snore. She has been told that she stops breathing in her sleep. She does not feel rested when she wakes up. She does nap during the day. She has never been told that she has sleep apnea. She has a history of anxiety and depression, managed on Rexulti, Flovoxamine and Clonazapam. She follows with psychiatry. She does not routinely exercise.  Review of Systems     Past Medical History:  Diagnosis Date   Anxiety    Depression    GERD (gastroesophageal reflux disease)    Hypertension     Current Outpatient Medications  Medication Sig Dispense Refill   clonazePAM (KLONOPIN) 0.5 MG tablet Take 0.5 mg by mouth daily as needed.     CONCERTA 27 MG CR tablet Take 27 mg by mouth every morning.     cyclobenzaprine (FLEXERIL) 5 MG tablet TAKE ONE TABLET BY MOUTH EVERY NIGHT AT BEDTIME AS NEEDED FOR MUSCLE SPASMS 20 tablet 0   fluvoxaMINE (LUVOX) 100 MG tablet Take 100 mg by mouth 2 (two) times daily.     mometasone (ELOCON) 0.1 % ointment Apply topically daily. 45 g 0   olmesartan (BENICAR) 20 MG tablet Take 1 tablet (20 mg total) by mouth daily. 90 tablet 1   omeprazole (PRILOSEC) 20 MG capsule Take 1 capsule (20 mg total) by mouth daily. 90 capsule 1   prazosin (MINIPRESS) 5 MG capsule Take 5 mg by mouth at bedtime.     REXULTI 1 MG TABS tablet Take 1 mg by mouth daily.     triamcinolone cream (KENALOG) 0.1 % Apply 1 application topically 2 (two) times daily. (Patient not taking: Reported on 07/13/2021) 60 g 0   No current facility-administered medications for this visit.    Allergies  Allergen Reactions   Bee Venom Rash   Lactose Diarrhea    Gas    Prednisone     Other reaction(s): Unknown   Sertraline  Diarrhea    Other reaction(s): Other (See Comments), Other (See Comments) Emotional numbness Emotional numbness    Lisinopril Cough and Other (See Comments)    cough cough     History reviewed. No pertinent family history.  Social History   Socioeconomic History   Marital status: Married    Spouse name: Not on file   Number of children: Not on file   Years of education: Not on file   Highest education level: Not on file  Occupational History   Not on file  Tobacco Use   Smoking status: Former    Types: Cigarettes    Quit date: 2012    Years since quitting: 10.9   Smokeless tobacco: Never  Vaping Use   Vaping Use: Never used  Substance and Sexual Activity   Alcohol use: Yes   Drug use: Never   Sexual activity: Not on file  Other Topics Concern   Not on file  Social History Narrative   Not on file   Social Determinants of Health   Financial Resource Strain: Not on file  Food Insecurity: Not on file  Transportation Needs: Not on file  Physical Activity: Not on file  Stress: Not on file  Social Connections: Not on file  Intimate Partner Violence: Not on file     Constitutional: Pt reports chronic fatigue. Denies fever, malaise, headache or abrupt weight changes.  Respiratory: Denies difficulty breathing, shortness of breath, cough or sputum production.   Cardiovascular: Denies chest pain, chest tightness, palpitations or swelling in the hands or feet.  Neurological: Pt reports insomnia, witnessed apnea. Denies dizziness, difficulty with memory, difficulty with speech Pt has a history of anxiety and depression. Denies SI/HI.  No other specific complaints in a complete review of systems (except as listed in HPI above).  Objective:   Physical Exam BP (!) 148/81 (BP Location: Right Arm, Patient Position: Sitting, Cuff Size: Large)    Pulse 80    Temp 97.8 F (36.6 C) (Temporal)    Resp 17    Ht 5\' 2"  (1.575 m)    Wt 198 lb (89.8 kg)    SpO2 97%    BMI 36.21 kg/m   Wt Readings from Last 3 Encounters:  07/13/21 198 lb (89.8 kg)  06/23/21 197 lb (89.4 kg)  04/21/21 193 lb 9.6 oz (87.8 kg)    General: Appears her stated age, obese, in NAD. Cardiovascular: Normal rate. Pulmonary/Chest: Normal effort. Neurological: Alert and oriented.  Psychiatric: Mood and affect normal. Behavior is normal. Judgment and thought content normal.         Assessment & Plan:   Chronic Fatigue, Insomnia, Witnessed Apnea:  ESS score of 14 Referral to pulmonology for consideration of sleep study Encouraged weight loss as this can help reduce sleep apnea symptoms  Return precautions discussed  Webb Silversmith, NP This visit occurred during the SARS-CoV-2 public health emergency.  Safety protocols were in place, including screening questions prior to the visit, additional usage of staff PPE, and extensive cleaning of exam room while observing appropriate contact time as indicated for disinfecting solutions.

## 2021-07-13 NOTE — Patient Instructions (Signed)
Sleep Apnea ?Sleep apnea affects breathing during sleep. It causes breathing to stop for 10 seconds or more, or to become shallow. People with sleep apnea usually snore loudly. ?It can also increase the risk of: ?Heart attack. ?Stroke. ?Being very overweight (obese). ?Diabetes. ?Heart failure. ?Irregular heartbeat. ?High blood pressure. ?The goal of treatment is to help you breathe normally again. ?What are the causes? ?The most common cause of this condition is a collapsed or blocked airway. ?There are three kinds of sleep apnea: ?Obstructive sleep apnea. This is caused by a blocked or collapsed airway. ?Central sleep apnea. This happens when the brain does not send the right signals to the muscles that control breathing. ?Mixed sleep apnea. This is a combination of obstructive and central sleep apnea. ?What increases the risk? ?Being overweight. ?Smoking. ?Having a small airway. ?Being older. ?Being female. ?Drinking alcohol. ?Taking medicines to calm yourself (sedatives or tranquilizers). ?Having family members with the condition. ?Having a tongue or tonsils that are larger than normal. ?What are the signs or symptoms? ?Trouble staying asleep. ?Loud snoring. ?Headaches in the morning. ?Waking up gasping. ?Dry mouth or sore throat in the morning. ?Being sleepy or tired during the day. ?If you are sleepy or tired during the day, you may also: ?Not be able to focus your mind (concentrate). ?Forget things. ?Get angry a lot and have mood swings. ?Feel sad (depressed). ?Have changes in your personality. ?Have less interest in sex, if you are female. ?Be unable to have an erection, if you are female. ?How is this treated? ? ?Sleeping on your side. ?Using a medicine to get rid of mucus in your nose (decongestant). ?Avoiding the use of alcohol, medicines to help you relax, or certain pain medicines (narcotics). ?Losing weight, if needed. ?Changing your diet. ?Quitting smoking. ?Using a machine to open your airway while you  sleep, such as: ?An oral appliance. This is a mouthpiece that shifts your lower jaw forward. ?A CPAP device. This device blows air through a mask when you breathe out (exhale). ?An EPAP device. This has valves that you put in each nostril. ?A BIPAP device. This device blows air through a mask when you breathe in (inhale) and breathe out. ?Having surgery if other treatments do not work. ?Follow these instructions at home: ?Lifestyle ?Make changes that your doctor recommends. ?Eat a healthy diet. ?Lose weight if needed. ?Avoid alcohol, medicines to help you relax, and some pain medicines. ?Do not smoke or use any products that contain nicotine or tobacco. If you need help quitting, ask your doctor. ?General instructions ?Take over-the-counter and prescription medicines only as told by your doctor. ?If you were given a machine to use while you sleep, use it only as told by your doctor. ?If you are having surgery, make sure to tell your doctor you have sleep apnea. You may need to bring your device with you. ?Keep all follow-up visits. ?Contact a doctor if: ?The machine that you were given to use during sleep bothers you or does not seem to be working. ?You do not get better. ?You get worse. ?Get help right away if: ?Your chest hurts. ?You have trouble breathing in enough air. ?You have an uncomfortable feeling in your back, arms, or stomach. ?You have trouble talking. ?One side of your body feels weak. ?A part of your face is hanging down. ?These symptoms may be an emergency. Get help right away. Call your local emergency services (911 in the [place] ?Do not wait to see if the symptoms   will go away. ?Do not drive yourself to the hospital. ?Summary ?This condition affects breathing during sleep. ?The most common cause is a collapsed or blocked airway. ?The goal of treatment is to help you breathe normally while you sleep. ?This information is not intended to replace advice given to you by your health care provider. Make  sure you discuss any questions you have with your health care provider. ?Document Revised: 02/10/2021 Document Reviewed: 06/12/2020 ?Elsevier Patient Education ? 2022 Elsevier Inc. ? ?

## 2021-07-13 NOTE — Assessment & Plan Note (Signed)
Encouraged weight loss as this can help reduce sleep apnea symptoms 

## 2021-07-13 NOTE — Addendum Note (Signed)
Addended by: Jearld Fenton on: 07/13/2021 07:45 AM   Modules accepted: Orders

## 2021-07-16 ENCOUNTER — Encounter: Payer: Self-pay | Admitting: Internal Medicine

## 2021-07-20 DIAGNOSIS — F909 Attention-deficit hyperactivity disorder, unspecified type: Secondary | ICD-10-CM | POA: Diagnosis not present

## 2021-07-20 DIAGNOSIS — F4310 Post-traumatic stress disorder, unspecified: Secondary | ICD-10-CM | POA: Diagnosis not present

## 2021-07-20 DIAGNOSIS — R69 Illness, unspecified: Secondary | ICD-10-CM | POA: Diagnosis not present

## 2021-07-20 DIAGNOSIS — F411 Generalized anxiety disorder: Secondary | ICD-10-CM | POA: Diagnosis not present

## 2021-07-22 ENCOUNTER — Other Ambulatory Visit: Payer: Self-pay | Admitting: Internal Medicine

## 2021-07-22 ENCOUNTER — Encounter: Payer: Self-pay | Admitting: Internal Medicine

## 2021-07-22 MED ORDER — OMEPRAZOLE 20 MG PO CPDR: 20.0000 mg | DELAYED_RELEASE_CAPSULE | Freq: Every day | ORAL | 1 refills | Status: DC

## 2021-07-22 NOTE — Telephone Encounter (Signed)
Duplicate request- filled by office today Requested Prescriptions  Pending Prescriptions Disp Refills   omeprazole (PRILOSEC) 20 MG capsule [Pharmacy Med Name: OMEPRAZOLE DR 20 MG CAPSULE] 90 capsule 1    Sig: TAKE ONE CAPSULE BY MOUTH DAILY     Gastroenterology: Proton Pump Inhibitors Passed - 07/22/2021  6:21 AM      Passed - Valid encounter within last 12 months    Recent Outpatient Visits          1 week ago Chronic fatigue   [facility] [place], [place], [place]   4 weeks ago Chronic pain of both shoulders   [facility] [place], Coralie Keens, NP   5 months ago Primary hypertension   [facility] [place], Coralie Keens, NP   6 months ago Primary hypertension   [facility] [place], Coralie Keens, NP

## 2021-07-28 ENCOUNTER — Encounter: Payer: 59 | Admitting: Internal Medicine

## 2021-08-02 ENCOUNTER — Other Ambulatory Visit: Payer: 59

## 2021-08-02 DIAGNOSIS — R7982 Elevated C-reactive protein (CRP): Secondary | ICD-10-CM | POA: Diagnosis not present

## 2021-08-03 ENCOUNTER — Encounter: Payer: Self-pay | Admitting: Internal Medicine

## 2021-08-03 DIAGNOSIS — R7982 Elevated C-reactive protein (CRP): Secondary | ICD-10-CM

## 2021-08-03 LAB — C-REACTIVE PROTEIN: CRP: 31.2 mg/L — ABNORMAL HIGH (ref <8.0)

## 2021-08-19 DIAGNOSIS — F411 Generalized anxiety disorder: Secondary | ICD-10-CM | POA: Diagnosis not present

## 2021-08-19 DIAGNOSIS — R69 Illness, unspecified: Secondary | ICD-10-CM | POA: Diagnosis not present

## 2021-08-19 DIAGNOSIS — F909 Attention-deficit hyperactivity disorder, unspecified type: Secondary | ICD-10-CM | POA: Diagnosis not present

## 2021-08-19 DIAGNOSIS — F4310 Post-traumatic stress disorder, unspecified: Secondary | ICD-10-CM | POA: Diagnosis not present

## 2021-08-20 ENCOUNTER — Encounter: Payer: 59 | Admitting: Internal Medicine

## 2021-08-23 ENCOUNTER — Other Ambulatory Visit: Payer: Self-pay | Admitting: Internal Medicine

## 2021-08-23 NOTE — Telephone Encounter (Signed)
Requested medications are due for refill today.  yes  Requested medications are on the active medications list.  yes  Last refill. 02/04/2021 #90 1 refill  Future visit scheduled.   no  Notes to clinic.  Failed protocol due to missing labs.    Requested Prescriptions  Pending Prescriptions Disp Refills   olmesartan (BENICAR) 20 MG tablet [Pharmacy Med Name: OLMESARTAN MEDOXOMIL 20 MG TAB] 90 tablet 1    Sig: TAKE ONE TABLET BY MOUTH DAILY     Cardiovascular:  Angiotensin Receptor Blockers Failed - 08/23/2021  6:22 AM      Failed - Cr in normal range and within 180 days    No results found for: CREATININE, LABCREAU, LABCREA, POCCRE        Failed - K in normal range and within 180 days    No results found for: K, POTASSIUM, POCK        Passed - Patient is not pregnant      Passed - Last BP in normal range    BP Readings from Last 1 Encounters:  07/13/21 137/76          Passed - Valid encounter within last 6 months    Recent Outpatient Visits           1 month ago Chronic fatigue   [facility] [employer], [place], NP   2 months ago Chronic pain of both shoulders   [facility] [place], Coralie Keens, NP   6 months ago Primary hypertension   [facility] [place], Coralie Keens, NP   7 months ago Primary hypertension   [facility] [place], Coralie Keens, NP

## 2021-08-27 ENCOUNTER — Institutional Professional Consult (permissible substitution): Payer: Self-pay | Admitting: Adult Health

## 2021-09-15 ENCOUNTER — Telehealth: Payer: Self-pay | Admitting: Gastroenterology

## 2021-09-15 NOTE — Telephone Encounter (Signed)
Patients spouse called and said they would like to reschedule the colonoscopy that was previously cancelled.  ?

## 2021-09-16 ENCOUNTER — Other Ambulatory Visit: Payer: Self-pay

## 2021-09-16 DIAGNOSIS — K625 Hemorrhage of anus and rectum: Secondary | ICD-10-CM

## 2021-09-16 MED ORDER — NA SULFATE-K SULFATE-MG SULF 17.5-3.13-1.6 GM/177ML PO SOLN: 354.0000 mL | Freq: Once | ORAL | 0 refills | Status: AC

## 2021-09-16 NOTE — Telephone Encounter (Signed)
Called patient's spouse-Courtney to reschedule patient's colonoscopy. They both agreed to have it done on 12/31/2021. ?

## 2021-09-20 DIAGNOSIS — R69 Illness, unspecified: Secondary | ICD-10-CM | POA: Diagnosis not present

## 2021-09-20 DIAGNOSIS — F411 Generalized anxiety disorder: Secondary | ICD-10-CM | POA: Diagnosis not present

## 2021-09-20 DIAGNOSIS — F4310 Post-traumatic stress disorder, unspecified: Secondary | ICD-10-CM | POA: Diagnosis not present

## 2021-09-20 DIAGNOSIS — F909 Attention-deficit hyperactivity disorder, unspecified type: Secondary | ICD-10-CM | POA: Diagnosis not present

## 2021-09-22 DIAGNOSIS — M2141 Flat foot [pes planus] (acquired), right foot: Secondary | ICD-10-CM | POA: Diagnosis not present

## 2021-09-22 DIAGNOSIS — M79674 Pain in right toe(s): Secondary | ICD-10-CM | POA: Diagnosis not present

## 2021-09-22 DIAGNOSIS — M791 Myalgia, unspecified site: Secondary | ICD-10-CM | POA: Diagnosis not present

## 2021-09-22 DIAGNOSIS — M2041 Other hammer toe(s) (acquired), right foot: Secondary | ICD-10-CM | POA: Diagnosis not present

## 2021-09-22 DIAGNOSIS — L603 Nail dystrophy: Secondary | ICD-10-CM | POA: Diagnosis not present

## 2021-09-22 DIAGNOSIS — M533 Sacrococcygeal disorders, not elsewhere classified: Secondary | ICD-10-CM | POA: Diagnosis not present

## 2021-09-22 DIAGNOSIS — R5382 Chronic fatigue, unspecified: Secondary | ICD-10-CM | POA: Diagnosis not present

## 2021-09-22 DIAGNOSIS — B351 Tinea unguium: Secondary | ICD-10-CM | POA: Diagnosis not present

## 2021-09-22 DIAGNOSIS — L608 Other nail disorders: Secondary | ICD-10-CM | POA: Diagnosis not present

## 2021-09-22 DIAGNOSIS — M2550 Pain in unspecified joint: Secondary | ICD-10-CM | POA: Diagnosis not present

## 2021-09-28 DIAGNOSIS — R69 Illness, unspecified: Secondary | ICD-10-CM | POA: Diagnosis not present

## 2021-10-05 DIAGNOSIS — R69 Illness, unspecified: Secondary | ICD-10-CM | POA: Diagnosis not present

## 2021-10-11 ENCOUNTER — Other Ambulatory Visit: Payer: Self-pay | Admitting: Internal Medicine

## 2021-10-11 DIAGNOSIS — F411 Generalized anxiety disorder: Secondary | ICD-10-CM | POA: Diagnosis not present

## 2021-10-11 DIAGNOSIS — F909 Attention-deficit hyperactivity disorder, unspecified type: Secondary | ICD-10-CM | POA: Diagnosis not present

## 2021-10-11 DIAGNOSIS — F4310 Post-traumatic stress disorder, unspecified: Secondary | ICD-10-CM | POA: Diagnosis not present

## 2021-10-11 DIAGNOSIS — R69 Illness, unspecified: Secondary | ICD-10-CM | POA: Diagnosis not present

## 2021-10-11 NOTE — Telephone Encounter (Signed)
Medication Refill - Medication:  ?olmesartan (BENICAR) 20 MG tablet  ? ?Has the patient contacted their pharmacy? Yes.   ?Contact PCP-(was originally sent to the wrong pharmacy) ? ?Preferred Pharmacy (with phone number or street name):  ?[id number] [place] - [address] Phone:  [phone] ?Fax:  [phone] ?  ? ?Has the patient been seen for an appointment in the last year OR does the patient have an upcoming appointment? Yes.   ? ?Agent: Please be advised that RX refills may take up to 3 business days. We ask that you follow-up with your pharmacy. ?

## 2021-10-13 ENCOUNTER — Telehealth: Payer: Self-pay | Admitting: *Deleted

## 2021-10-13 NOTE — Telephone Encounter (Signed)
Picked up 08/27/21 at [facility]. ?Requested Prescriptions  ?Pending Prescriptions Disp Refills  ?? olmesartan (BENICAR) 20 MG tablet 90 tablet 0  ?  Sig: Take 1 tablet (20 mg total) by mouth daily.  ?  ? Cardiovascular:  Angiotensin Receptor Blockers Failed - 10/11/2021 11:47 PM  ?  ?  Failed - Cr in normal range and within 180 days  ?  No results found for: CREATININE, LABCREAU, LABCREA, POCCRE   ?  ?  Failed - K in normal range and within 180 days  ?  No results found for: K, POTASSIUM, POCK   ?  ?  Passed - Patient is not pregnant  ?  ?  Passed - Last BP in normal range  ?  BP Readings from Last 1 Encounters:  ?07/13/21 137/76  ?   ?  ?  Passed - Valid encounter within last 6 months  ?  Recent Outpatient Visits   ?      ? 3 months ago Chronic fatigue  ? [facility] [place], [place], NP  ? 3 months ago Chronic pain of both shoulders  ? [facility] [place], [place], NP  ? 8 months ago Primary hypertension  ? [facility] [place], [place], NP  ? 8 months ago Primary hypertension  ? [facility] [place], Coralie Keens, NP  ?  ?  ?Future Appointments   ?        ? In 5 days [place], Coralie Keens, NP [facility], [place]  ?  ? ?  ?  ?  ? ? ?

## 2021-10-13 NOTE — Telephone Encounter (Signed)
Called to verify if pt had picked up medication since she is calling and requesting it from a different pharm. Sara Andrade [address] states she picked it up 08/27/21. ?

## 2021-10-14 ENCOUNTER — Telehealth: Payer: Self-pay | Admitting: Gastroenterology

## 2021-10-14 DIAGNOSIS — R69 Illness, unspecified: Secondary | ICD-10-CM | POA: Diagnosis not present

## 2021-10-14 NOTE — Telephone Encounter (Signed)
Medical records were sent for DOS 07/18/2000 to present. Lenis Noon 04/21/2021 ?

## 2021-10-18 ENCOUNTER — Ambulatory Visit: Payer: 59 | Admitting: Internal Medicine

## 2021-10-18 ENCOUNTER — Telehealth: Payer: 59 | Admitting: Internal Medicine

## 2021-10-19 DIAGNOSIS — R69 Illness, unspecified: Secondary | ICD-10-CM | POA: Diagnosis not present

## 2021-10-23 ENCOUNTER — Other Ambulatory Visit: Payer: Self-pay | Admitting: Gastroenterology

## 2021-10-26 ENCOUNTER — Telehealth: Payer: Self-pay | Admitting: Gastroenterology

## 2021-10-26 NOTE — Telephone Encounter (Signed)
Medical records were sent out  to [facility] on 10/26/2021 ?

## 2021-10-27 ENCOUNTER — Other Ambulatory Visit: Payer: Self-pay | Admitting: Internal Medicine

## 2021-11-02 DIAGNOSIS — R69 Illness, unspecified: Secondary | ICD-10-CM | POA: Diagnosis not present

## 2021-11-03 ENCOUNTER — Telehealth: Payer: Self-pay

## 2021-11-03 NOTE — Telephone Encounter (Signed)
Lm for patient to ask if she has had previous sleep study.  

## 2021-11-04 NOTE — Telephone Encounter (Signed)
Lm x2 for patient.  Will close encounter per office protocol.   

## 2021-11-05 ENCOUNTER — Encounter: Payer: Self-pay | Admitting: Adult Health

## 2021-11-05 ENCOUNTER — Ambulatory Visit: Payer: 59 | Admitting: Adult Health

## 2021-11-05 VITALS — BP 118/74 | HR 76 | Temp 97.9°F | Ht 62.0 in | Wt 207.2 lb

## 2021-11-05 DIAGNOSIS — R0683 Snoring: Secondary | ICD-10-CM | POA: Diagnosis not present

## 2021-11-05 DIAGNOSIS — Z6836 Body mass index (BMI) 36.0-36.9, adult: Secondary | ICD-10-CM

## 2021-11-05 NOTE — Progress Notes (Signed)
? ?'@Patient'  ID: Sara Andrade, female    DOB: Jan 30, 1981, 41 y.o.   MRN: [id number] ? ?Chief Complaint  ?Patient presents with  ? sleep consult  ? ? ?Referring provider: ?Jearld Fenton, NP ? ?HPI: ?41 year old female seen for sleep consult November 05, 2021 for snoring, restless sleep, witnessed apneic events and daytime sleepiness ? ?TEST/EVENTS :  ? ?11/05/2021 Sleep consult  ?Patient presents for a sleep consult today.  She was kindly referred over by her primary care provider Webb Silversmith, NP patient complains that she has snoring, restless sleep, daytime sleepiness.  Witnessed apneic events by spouse   And has had some night terrors.  Has very vivid dreams and times a physical . Typically goes to bed about 8:30 PM.  Takes about 30 minutes to go to sleep.  Is up multiple times throughout the night.  And usually gets out of bed about 6 AM.  She wakes up unrefreshed.  Weight has been up about 8 pounds over the last 2 years.  Current weight is at 207 pounds with a BMI at 37.9.  Caffeine intake 1/2 cup of coffee daily .  ?No symptoms suspicious for cataplexy or sleep paralysis.  Epworth score is 15 out of 24.  Gets very sleepy with inactivity such as sitting still or watching TV. ?Patient has very large breast and feels that this causes her breathing problems at nighttime she says she cannot sleep on her back feels like it cuts her breathing off ? ?Medical history is significant for hypertension and anxiety , depression , PTSD , Hip pain/DJD , ADHD , Fibromyalgia , agoraphobia ? ?Social history patient is unemployed.  Lives with her spouse.  Drinks socially.  Former smoker.  ? ?Family history unknown patient is adopted ? ?Surgical history hysterectomy in 2018,  ? ?Allergies  ?Allergen Reactions  ? Bee Venom Rash  ? Lactose Diarrhea  ?  Gas ?  ? Prednisone   ?  Other reaction(s): Unknown  ? Sertraline Diarrhea  ?  Other reaction(s): Other (See Comments), Other (See Comments) ?Emotional numbness ?Emotional numbness ?  ?  Lisinopril Cough and Other (See Comments)  ?  cough ?cough ?  ? ? ?Immunization History  ?Administered Date(s) Administered  ? Influenza,inj,Quad PF,6+ Mos 05/11/2018, 06/23/2021  ? Influenza-Unspecified 04/18/2015, 04/12/2016, 04/17/2019  ? MMR 04/29/2014, 06/04/2014  ? PFIZER(Purple Top)SARS-COV-2 Vaccination 08/15/2019, 09/06/2019  ? Tdap 04/29/2014, 04/02/2015  ? ? ?Past Medical History:  ?Diagnosis Date  ? Anxiety   ? Depression   ? GERD (gastroesophageal reflux disease)   ? Hypertension   ? ? ?Tobacco History: ?Social History  ? ?Tobacco Use  ?Smoking Status Former  ? Packs/day: 1.00  ? Years: 3.00  ? Pack years: 3.00  ? Types: Cigarettes  ? Quit date: 2012  ? Years since quitting: 11.3  ?Smokeless Tobacco Never  ? ?Counseling given: Not Answered ? ? ?Outpatient Medications Prior to Visit  ?Medication Sig Dispense Refill  ? clonazePAM (KLONOPIN) 0.5 MG tablet Take 0.5 mg by mouth daily as needed.    ? cyclobenzaprine (FLEXERIL) 5 MG tablet TAKE ONE TABLET BY MOUTH EVERY NIGHT AT BEDTIME AS NEEDED FOR MUSCLE SPASMS 20 tablet 0  ? fluvoxaMINE (LUVOX) 100 MG tablet Take 100 mg by mouth 2 (two) times daily.    ? gabapentin (NEURONTIN) 100 MG capsule Take by mouth.    ? mometasone (ELOCON) 0.1 % ointment Apply topically daily. 45 g 0  ? olmesartan (BENICAR) 20 MG tablet TAKE ONE TABLET BY  MOUTH DAILY 90 tablet 0  ? omeprazole (PRILOSEC) 20 MG capsule Take 1 capsule (20 mg total) by mouth daily. 90 capsule 1  ? prazosin (MINIPRESS) 5 MG capsule Take 5 mg by mouth at bedtime.    ? REXULTI 1 MG TABS tablet Take 1 mg by mouth daily.    ? triamcinolone cream (KENALOG) 0.1 % Apply 1 application topically 2 (two) times daily. 60 g 0  ? Vitamin D, Ergocalciferol, (DRISDOL) 1.25 MG (50000 UNIT) CAPS capsule Take 50,000 Units by mouth once a week.    ? CONCERTA 27 MG CR tablet Take 27 mg by mouth every morning.    ? ?No facility-administered medications prior to visit.  ? ? ? ?Review of Systems:  ? ?Constitutional:   No   weight loss, night sweats,  Fevers, chills, ?+ fatigue, or  lassitude. ? ?HEENT:   No headaches,  Difficulty swallowing,  Tooth/dental problems, or  Sore throat,  ?              No sneezing, itching, ear ache, nasal congestion, post nasal drip,  ? ?CV:  No chest pain,  Orthopnea, PND, swelling in lower extremities, anasarca, dizziness, palpitations, syncope.  ? ?GI  No heartburn, indigestion, abdominal pain, nausea, vomiting, diarrhea, change in bowel habits, loss of appetite, bloody stools.  ? ?Resp: No shortness of breath with exertion or at rest.  No excess mucus, no productive cough,  No non-productive cough,  No coughing up of blood.  No change in color of mucus.  No wheezing.  No chest wall deformity ? ?Skin: no rash or lesions. ? ?GU: no dysuria, change in color of urine, no urgency or frequency.  No flank pain, no hematuria  ? ?MS:  + joint pain  ? ? ? ?Physical Exam ? ?BP 118/74 (BP Location: Left Arm, Cuff Size: Normal)   Pulse 76   Temp 97.9 ?F (36.6 ?C) (Temporal)   Ht '5\' 2"'  (1.575 m)   Wt 207 lb 3.2 oz (94 kg)   SpO2 97%   BMI 37.90 kg/m?  ? ?GEN: A/Ox3; pleasant , NAD, well nourished  ?  ?HEENT:  [place]/AT,  EACs-clear, TMs-wnl, NOSE-clear, THROAT-clear, no lesions, no postnasal drip or exudate noted. Class 2-3 MP airway  ? ?NECK:  Supple w/ fair ROM; no JVD; normal carotid impulses w/o bruits; no thyromegaly or nodules palpated; no lymphadenopathy.   ? ?RESP  Clear  P & A; w/o, wheezes/ rales/ or rhonchi. no accessory muscle use, no dullness to percussion  ? ?CARD:  RRR, no m/r/g, no peripheral edema, pulses intact, no cyanosis or clubbing. ? ?GI:   Soft & nt; nml bowel sounds; no organomegaly or masses detected.  ? ?Musco: Warm bil, no deformities or joint swelling noted.  ? ?Neuro: alert, no focal deficits noted.   ? ?Skin: Warm, no lesions or rashes ? ? ? ?Lab Results: ? ?CBC ?No results found for: WBC, RBC, HGB, HCT, PLT, MCV, MCH, MCHC, RDW, LYMPHSABS, MONOABS, EOSABS, BASOSABS ? ?BMET ?No  results found for: NA, K, CL, CO2, GLUCOSE, BUN, CREATININE, CALCIUM, GFRNONAA, GFRAA ? ?BNP ?No results found for: BNP ? ?ProBNP ?No results found for: PROBNP ? ?Imaging: ?No results found. ? ? ? ?   ? View : No data to display.  ?  ?  ?  ? ? ?No results found for: NITRICOXIDE ? ? ? ? ? ?Assessment & Plan:  ? ?Snoring ?Snoring, restless sleep, witnessed apneic events, daytime sleepiness, vivid dreams, teeth  grinding all suspicious for underlying sleep apnea along with BMI at 37 ?We will set patient up for home sleep study patient education given on sleep apnea ? ?- discussed how weight can impact sleep and risk for sleep disordered breathing ?- discussed options to assist with weight loss: combination of diet modification, cardiovascular and strength training exercises ?  ?- had an extensive discussion regarding the adverse health consequences related to untreated sleep disordered breathing ?- specifically discussed the risks for hypertension, coronary artery disease, cardiac dysrhythmias, cerebrovascular disease, and diabetes ?- lifestyle modification discussed ?  ?- discussed how sleep disruption can increase risk of accidents, particularly when driving ?- safe driving practices were discussed ?  ? ?Plan  ?Patient Instructions  ?Set up for home sleep study ?Healthy sleep regimen ?Work on healthy weight loss ?Do not drive if sleepy ?Follow-up in 6 to 8 weeks to discuss sleep study results and treatment plan ?  ? ? ?Class 2 obesity due to excess calories with body mass index (BMI) of 36.0 to 36.9 in adult ?Healthy weight loss discussed ? ? ? ? ?Rexene Edison, NP ?11/05/2021 ? ?

## 2021-11-05 NOTE — Assessment & Plan Note (Signed)
Healthy weight loss discussed 

## 2021-11-05 NOTE — Patient Instructions (Signed)
Set up for home sleep study ?Healthy sleep regimen ?Work on healthy weight loss ?Do not drive if sleepy ?Follow-up in 6 to 8 weeks to discuss sleep study results and treatment plan ?

## 2021-11-05 NOTE — Assessment & Plan Note (Signed)
Snoring, restless sleep, witnessed apneic events, daytime sleepiness, vivid dreams, teeth grinding all suspicious for underlying sleep apnea along with BMI at 37 ?We will set patient up for home sleep study patient education given on sleep apnea ? ?- discussed how weight can impact sleep and risk for sleep disordered breathing ?- discussed options to assist with weight loss: combination of diet modification, cardiovascular and strength training exercises ?  ?- had an extensive discussion regarding the adverse health consequences related to untreated sleep disordered breathing ?- specifically discussed the risks for hypertension, coronary artery disease, cardiac dysrhythmias, cerebrovascular disease, and diabetes ?- lifestyle modification discussed ?  ?- discussed how sleep disruption can increase risk of accidents, particularly when driving ?- safe driving practices were discussed ?  ? ?Plan  ?Patient Instructions  ?Set up for home sleep study ?Healthy sleep regimen ?Work on healthy weight loss ?Do not drive if sleepy ?Follow-up in 6 to 8 weeks to discuss sleep study results and treatment plan ?  ? ?

## 2021-11-05 NOTE — Progress Notes (Signed)
Reviewed and agree with assessment/plan. ? ? ?Chesley Mires, MD ?[place] ?11/05/2021, 4:34 PM ?Pager:  [phone] ? ?

## 2021-11-17 IMAGING — DX DG LUMBAR SPINE COMPLETE 4+V
6 series · 6 of 6 positions shown · non-contrast
Comparison: None.

CLINICAL DATA: Back pain

EXAM:
LUMBAR SPINE - COMPLETE 4+ VIEW

[l-spine ap]
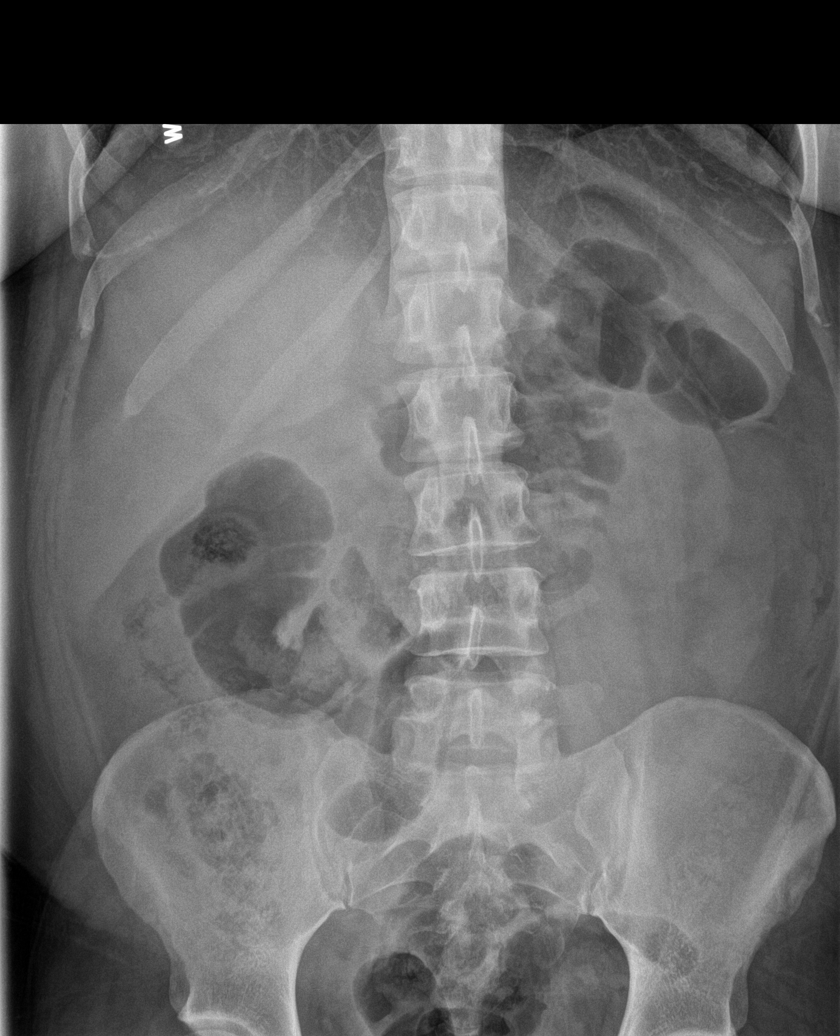

[l-spine obl (1 of 3)]
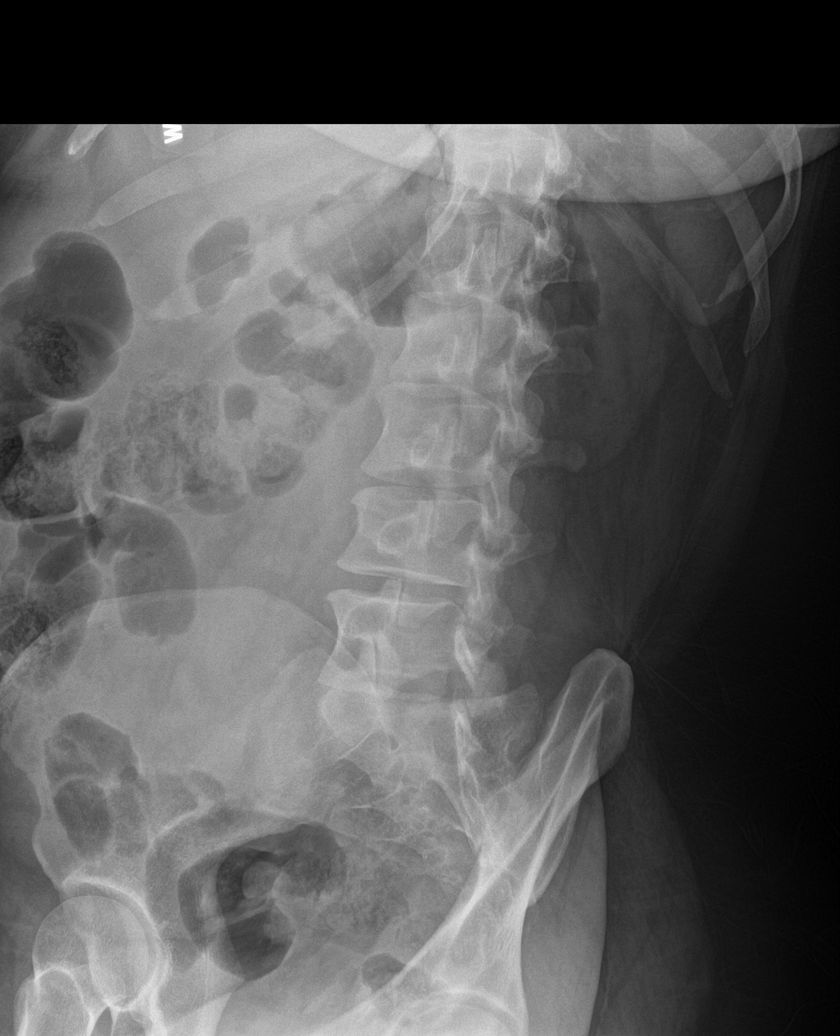

[l-spine lat]
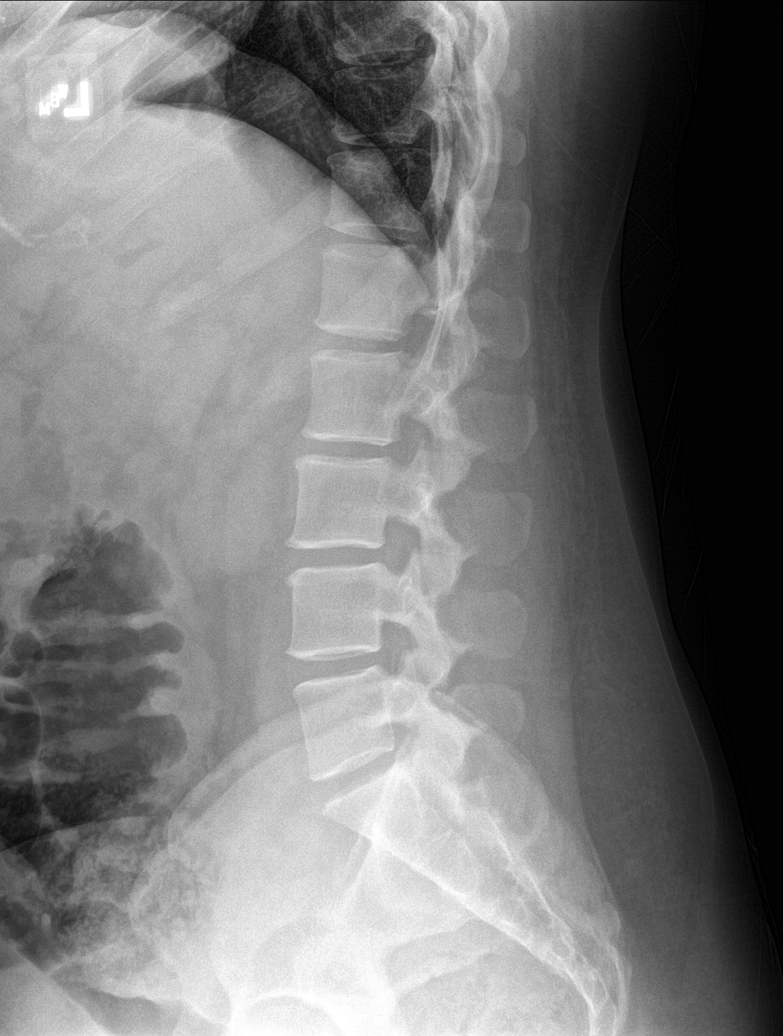

[l-spine spot]
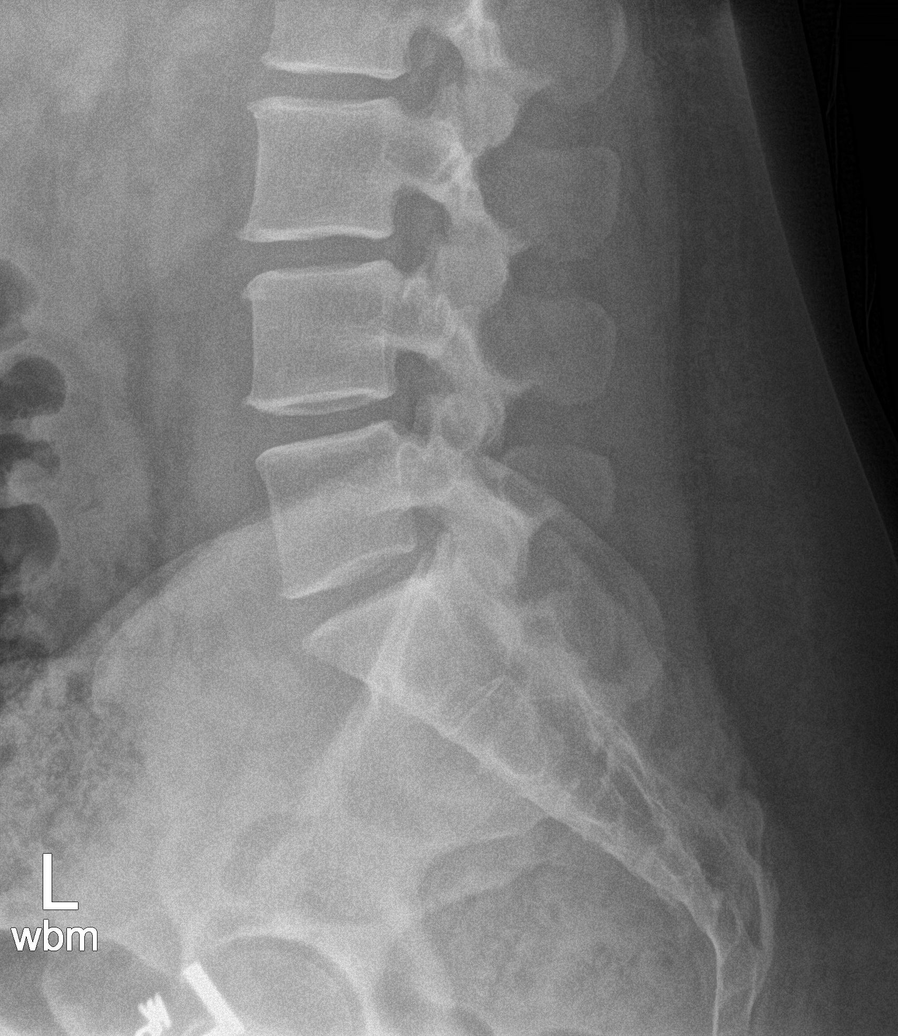

[l-spine obl (2 of 3)]
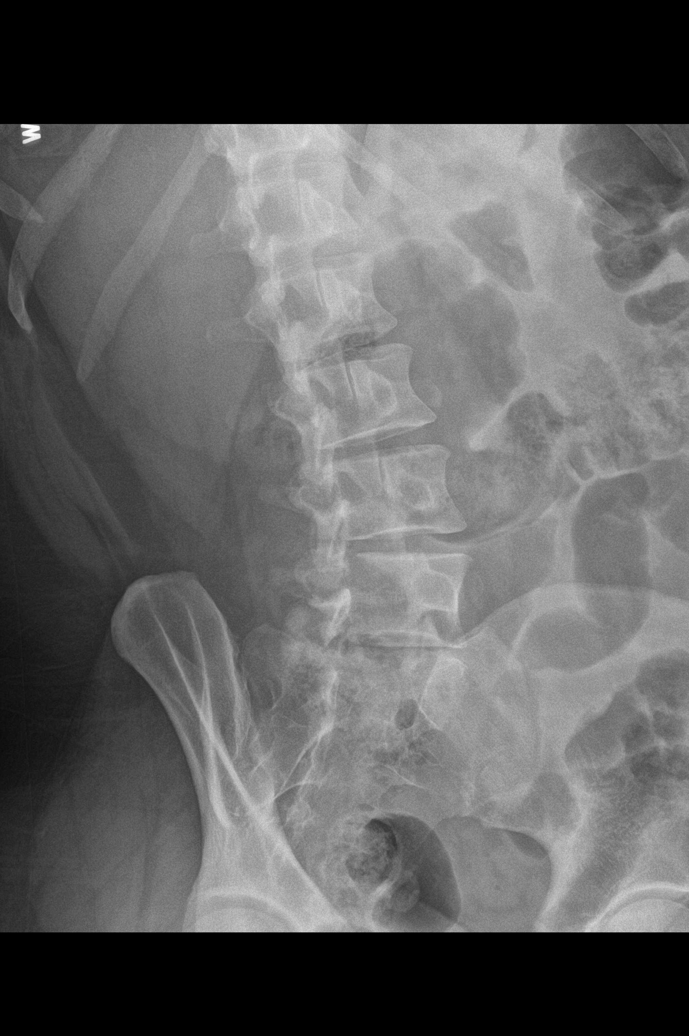

[l-spine obl (3 of 3)]
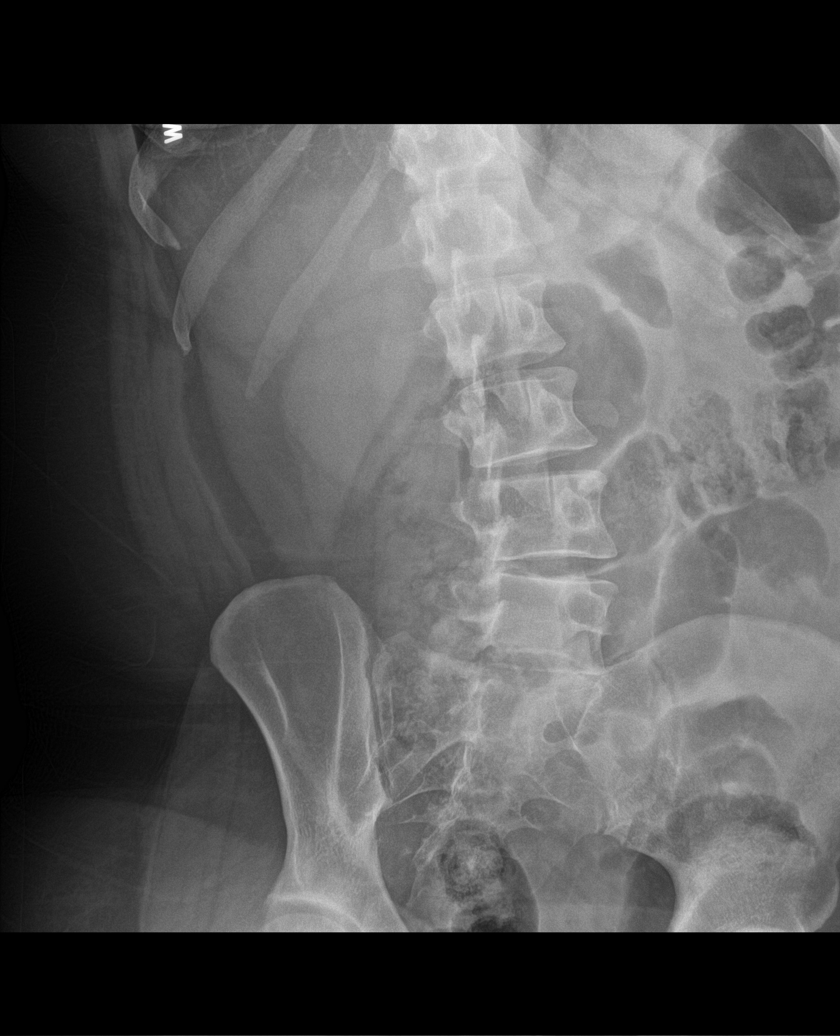

[6 of 6 positions shown; findings below may reference images not displayed]

FINDINGS: There is no evidence of lumbar spine fracture. Alignment is normal.
Intervertebral disc spaces are maintained.
IMPRESSION: Negative.

## 2021-11-18 DIAGNOSIS — F411 Generalized anxiety disorder: Secondary | ICD-10-CM | POA: Diagnosis not present

## 2021-11-18 DIAGNOSIS — F4310 Post-traumatic stress disorder, unspecified: Secondary | ICD-10-CM | POA: Diagnosis not present

## 2021-11-18 DIAGNOSIS — F909 Attention-deficit hyperactivity disorder, unspecified type: Secondary | ICD-10-CM | POA: Diagnosis not present

## 2021-11-18 DIAGNOSIS — R69 Illness, unspecified: Secondary | ICD-10-CM | POA: Diagnosis not present

## 2021-11-22 ENCOUNTER — Telehealth: Payer: 59 | Admitting: Internal Medicine

## 2021-11-22 NOTE — Progress Notes (Deleted)
Virtual Visit via Video Note  I connected with Sara Andrade on 11/22/21 at 11:20 AM EDT by a video enabled telemedicine application and verified that I am speaking with the correct person using two identifiers.  Location: Patient: *** Provider: Office  Person's participating in this video call: Webb Silversmith, NP and Sara Andrade.   I discussed the limitations of evaluation and management by telemedicine and the availability of in person appointments. The patient expressed understanding and agreed to proceed.  History of Present Illness:  Pt reports.   Past Medical History:  Diagnosis Date   Anxiety    Depression    GERD (gastroesophageal reflux disease)    Hypertension     Current Outpatient Medications  Medication Sig Dispense Refill   clonazePAM (KLONOPIN) 0.5 MG tablet Take 0.5 mg by mouth daily as needed.     cyclobenzaprine (FLEXERIL) 5 MG tablet TAKE ONE TABLET BY MOUTH EVERY NIGHT AT BEDTIME AS NEEDED FOR MUSCLE SPASMS 20 tablet 0   fluvoxaMINE (LUVOX) 100 MG tablet Take 100 mg by mouth 2 (two) times daily.     gabapentin (NEURONTIN) 100 MG capsule Take by mouth.     mometasone (ELOCON) 0.1 % ointment Apply topically daily. 45 g 0   olmesartan (BENICAR) 20 MG tablet TAKE ONE TABLET BY MOUTH DAILY 90 tablet 0   omeprazole (PRILOSEC) 20 MG capsule Take 1 capsule (20 mg total) by mouth daily. 90 capsule 1   prazosin (MINIPRESS) 5 MG capsule Take 5 mg by mouth at bedtime.     REXULTI 1 MG TABS tablet Take 1 mg by mouth daily.     triamcinolone cream (KENALOG) 0.1 % Apply 1 application topically 2 (two) times daily. 60 g 0   Vitamin D, Ergocalciferol, (DRISDOL) 1.25 MG (50000 UNIT) CAPS capsule Take 50,000 Units by mouth once a week.     No current facility-administered medications for this visit.    Allergies  Allergen Reactions   Bee Venom Rash   Lactose Diarrhea    Gas    Prednisone     Other reaction(s): Unknown   Sertraline Diarrhea    Other reaction(s):  Other (See Comments), Other (See Comments) Emotional numbness Emotional numbness    Lisinopril Cough and Other (See Comments)    cough cough     No family history on file.  Social History   Socioeconomic History   Marital status: Married    Spouse name: Not on file   Number of children: Not on file   Years of education: Not on file   Highest education level: Not on file  Occupational History   Not on file  Tobacco Use   Smoking status: Former    Packs/day: 1.00    Years: 3.00    Pack years: 3.00    Types: Cigarettes    Quit date: 2012    Years since quitting: 11.3   Smokeless tobacco: Never  Vaping Use   Vaping Use: Never used  Substance and Sexual Activity   Alcohol use: Yes   Drug use: Never   Sexual activity: Not on file  Other Topics Concern   Not on file  Social History Narrative   Not on file   Social Determinants of Health   Financial Resource Strain: Not on file  Food Insecurity: Not on file  Transportation Needs: Not on file  Physical Activity: Not on file  Stress: Not on file  Social Connections: Not on file  Intimate Partner Violence: Not on file  Constitutional: Denies fever, malaise, fatigue, headache or abrupt weight changes.  HEENT: Denies eye pain, eye redness, ear pain, ringing in the ears, wax buildup, runny nose, nasal congestion, bloody nose, or sore throat. Respiratory: Denies difficulty breathing, shortness of breath, cough or sputum production.   Cardiovascular: Denies chest pain, chest tightness, palpitations or swelling in the hands or feet.  Gastrointestinal: Denies abdominal pain, bloating, constipation, diarrhea or blood in the stool.  GU: Denies urgency, frequency, pain with urination, burning sensation, blood in urine, odor or discharge. Musculoskeletal: Denies decrease in range of motion, difficulty with gait, muscle pain or joint pain and swelling.  Skin: Denies redness, rashes, lesions or ulcercations.  Neurological:  Denies dizziness, difficulty with memory, difficulty with speech or problems with balance and coordination.  Psych: Denies anxiety, depression, SI/HI.  No other specific complaints in a complete review of systems (except as listed in HPI above).    Observations/Objective:  There were no vitals taken for this visit. Wt Readings from Last 3 Encounters:  11/05/21 207 lb 3.2 oz (94 kg)  07/13/21 198 lb (89.8 kg)  06/23/21 197 lb (89.4 kg)    General: Appears their stated age, well developed, well nourished in NAD. Skin: Warm, dry and intact. No rashes, lesions or ulcerations noted. HEENT: Head: normal shape and size; Eyes: sclera white, no icterus, conjunctiva pink, PERRLA and EOMs intact; Ears: Tm's gray and intact, normal light reflex; Nose: mucosa pink and moist, septum midline; Throat/Mouth: Teeth present, mucosa pink and moist, no exudate, lesions or ulcerations noted.  Neck:  Neck supple, trachea midline. No masses, lumps or thyromegaly present.  Cardiovascular: Normal rate and rhythm. S1,S2 noted.  No murmur, rubs or gallops noted. No JVD or BLE edema. No carotid bruits noted. Pulmonary/Chest: Normal effort and positive vesicular breath sounds. No respiratory distress. No wheezes, rales or ronchi noted.  Abdomen: Soft and nontender. Normal bowel sounds. No distention or masses noted. Liver, spleen and kidneys non palpable. Musculoskeletal: Normal range of motion. No signs of joint swelling. No difficulty with gait.  Neurological: Alert and oriented. Cranial nerves II-XII grossly intact. Coordination normal.  Psychiatric: Mood and affect normal. Behavior is normal. Judgment and thought content normal.      Assessment and Plan:   Follow Up Instructions:    I discussed the assessment and treatment plan with the patient. The patient was provided an opportunity to ask questions and all were answered. The patient agreed with the plan and demonstrated an understanding of the  instructions.   The patient was advised to call back or seek an in-person evaluation if the symptoms worsen or if the condition fails to improve as anticipated.   Webb Silversmith, NP

## 2021-12-01 ENCOUNTER — Ambulatory Visit: Payer: 59

## 2021-12-01 DIAGNOSIS — R0683 Snoring: Secondary | ICD-10-CM

## 2021-12-01 DIAGNOSIS — G4733 Obstructive sleep apnea (adult) (pediatric): Secondary | ICD-10-CM

## 2021-12-07 ENCOUNTER — Other Ambulatory Visit: Payer: Self-pay | Admitting: Internal Medicine

## 2021-12-14 DIAGNOSIS — R69 Illness, unspecified: Secondary | ICD-10-CM | POA: Diagnosis not present

## 2021-12-15 ENCOUNTER — Ambulatory Visit: Payer: 59 | Admitting: Internal Medicine

## 2021-12-15 DIAGNOSIS — Z0001 Encounter for general adult medical examination with abnormal findings: Secondary | ICD-10-CM | POA: Diagnosis not present

## 2021-12-15 DIAGNOSIS — R69 Illness, unspecified: Secondary | ICD-10-CM | POA: Diagnosis not present

## 2021-12-15 DIAGNOSIS — Z6839 Body mass index (BMI) 39.0-39.9, adult: Secondary | ICD-10-CM | POA: Diagnosis not present

## 2021-12-15 DIAGNOSIS — Z13228 Encounter for screening for other metabolic disorders: Secondary | ICD-10-CM | POA: Diagnosis not present

## 2021-12-15 DIAGNOSIS — Z1389 Encounter for screening for other disorder: Secondary | ICD-10-CM | POA: Diagnosis not present

## 2021-12-15 DIAGNOSIS — I10 Essential (primary) hypertension: Secondary | ICD-10-CM | POA: Diagnosis not present

## 2021-12-16 DIAGNOSIS — F411 Generalized anxiety disorder: Secondary | ICD-10-CM | POA: Diagnosis not present

## 2021-12-16 DIAGNOSIS — F4310 Post-traumatic stress disorder, unspecified: Secondary | ICD-10-CM | POA: Diagnosis not present

## 2021-12-16 DIAGNOSIS — F909 Attention-deficit hyperactivity disorder, unspecified type: Secondary | ICD-10-CM | POA: Diagnosis not present

## 2021-12-16 DIAGNOSIS — R69 Illness, unspecified: Secondary | ICD-10-CM | POA: Diagnosis not present

## 2021-12-22 DIAGNOSIS — G4733 Obstructive sleep apnea (adult) (pediatric): Secondary | ICD-10-CM | POA: Diagnosis not present

## 2021-12-31 ENCOUNTER — Ambulatory Visit: Admission: RE | Admit: 2021-12-31 | Payer: 59 | Source: Ambulatory Visit | Admitting: Gastroenterology

## 2021-12-31 ENCOUNTER — Encounter: Admission: RE | Payer: Self-pay | Source: Ambulatory Visit

## 2021-12-31 SURGERY — COLONOSCOPY WITH PROPOFOL
Anesthesia: General

## 2022-01-03 ENCOUNTER — Telehealth (INDEPENDENT_AMBULATORY_CARE_PROVIDER_SITE_OTHER): Payer: 59 | Admitting: Adult Health

## 2022-01-03 ENCOUNTER — Encounter: Payer: Self-pay | Admitting: Adult Health

## 2022-01-03 ENCOUNTER — Telehealth: Payer: Self-pay | Admitting: *Deleted

## 2022-01-03 DIAGNOSIS — G4733 Obstructive sleep apnea (adult) (pediatric): Secondary | ICD-10-CM | POA: Diagnosis not present

## 2022-01-03 NOTE — Telephone Encounter (Signed)
ATC patient to see if she was having problems logging on.  LVM that we would await her to log on again and if we did not see her log on, we would call back.

## 2022-01-03 NOTE — Telephone Encounter (Signed)
ATC x2, reached VM about the time patient connected to mychart visit.  Called and spoke with patient, advised to connect and wait in the waiting room online and we would join her.  She verbalized understanding.  Nothing further needed.

## 2022-01-03 NOTE — Patient Instructions (Addendum)
Begin CPAP at bedtime and with naps Goal is to wear your CPAP all night long for at least 6 or more hours Work on healthy weight Do not drive if sleepy Healthy sleep regimen Follow-up in 2 to 3 months and as needed

## 2022-01-03 NOTE — Progress Notes (Signed)
Virtual Visit via Video Note  I connected with Sara Andrade on 01/03/22 at  2:00 PM EDT by a video enabled telemedicine application and verified that I am speaking with the correct person using two identifiers.  Location: Patient: Home  Provider: Office    I discussed the limitations of evaluation and management by telemedicine and the availability of in person appointments. The patient expressed understanding and agreed to proceed.  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  Patient was seen last visit for a sleep consult November 05, 2021 for snoring, witnessed apneic events and daytime sleepiness.  She was set up for a repeat sleep study 12/01/21 that showed Mild OSA with AHI at 9.4/hr and Spo2 at 57%.  We discussed her sleep study results.  Went over treatment options including weight loss oral appliance and CPAP.  We discussed setting up for a CPAP titration study.  Patient says that she cannot do that she has significant agoraphobia and does not want to go for an in lab study.  We discussed getting back on the CPAP working on trying to find a better mask fit and better pressure settings.  Once she is controlled on CPAP set up overnight oximetry test on CPAP to make sure her nocturnal hypoxemia resolves on CPAP  Past Medical History:  Diagnosis Date   Anxiety    Depression    GERD (gastroesophageal reflux disease)    Hypertension     Current Outpatient Medications on File Prior to Visit  Medication Sig Dispense Refill   clonazePAM (KLONOPIN) 0.5 MG tablet Take 0.5 mg by mouth daily as needed.     cyclobenzaprine (FLEXERIL) 5 MG tablet TAKE ONE TABLET BY MOUTH EVERY NIGHT AT BEDTIME AS NEEDED FOR MUSCLE SPASMS 20 tablet 0   fluvoxaMINE (LUVOX) 100 MG tablet Take 100 mg by mouth 2  (two) times daily.     gabapentin (NEURONTIN) 300 MG capsule Take 300 mg by mouth 3 (three) times daily.     olmesartan (BENICAR) 40 MG tablet Take 40 mg by mouth daily.     omeprazole (PRILOSEC) 40 MG capsule Take 40 mg by mouth daily.     REXULTI 2 MG TABS tablet Take 2 mg by mouth daily.     Vitamin D, Ergocalciferol, (DRISDOL) 1.25 MG (50000 UNIT) CAPS capsule Take 50,000 Units by mouth once a week.     No current facility-administered medications on file prior to visit.      Observations/Objective: Appears in NAD   Assessment and Plan: Mild obstructive sleep apnea with nocturnal hypoxemia.-Sleep study has some significant hypoxemia may have a component of OHS.  Would like for patient to go for CPAP titration study however patient declines due to underlying agoraphobia.  We will set her up with auto CPAP mask fitting.  And then when she is well controlled on CPAP we will check a ONO on CPAP  Begin auto CPAP 5 to 15 cm H2O  Plan  Patient Instructions  Begin CPAP at bedtime and with naps Goal is to wear your CPAP all night long for at least 6 or more hours Work on healthy weight Do not drive if sleepy Healthy sleep regimen Follow-up in 2 to 3 months and as needed    Follow Up Instructions:    I discussed the assessment  and treatment plan with the patient. The patient was provided an opportunity to ask questions and all were answered. The patient agreed with the plan and demonstrated an understanding of the instructions.   The patient was advised to call back or seek an in-person evaluation if the symptoms worsen or if the condition fails to improve as anticipated.  I provided 22 minutes of non-face-to-face time during this encounter.   Rexene Edison, NP

## 2022-01-03 NOTE — Progress Notes (Signed)
Reviewed and agree with assessment/plan.   Chesley Mires, MD [facility] Pulmonary/Critical Care 01/03/2022, 4:05 PM Pager:  [phone]

## 2022-01-12 ENCOUNTER — Ambulatory Visit (INDEPENDENT_AMBULATORY_CARE_PROVIDER_SITE_OTHER): Payer: 59 | Admitting: Internal Medicine

## 2022-01-12 ENCOUNTER — Encounter: Payer: Self-pay | Admitting: Internal Medicine

## 2022-01-12 VITALS — BP 159/100 | HR 75 | Temp 97.8°F | Ht 62.0 in | Wt 217.0 lb

## 2022-01-12 DIAGNOSIS — I10 Essential (primary) hypertension: Secondary | ICD-10-CM | POA: Diagnosis not present

## 2022-01-12 DIAGNOSIS — Z0001 Encounter for general adult medical examination with abnormal findings: Secondary | ICD-10-CM | POA: Diagnosis not present

## 2022-01-12 DIAGNOSIS — Z6839 Body mass index (BMI) 39.0-39.9, adult: Secondary | ICD-10-CM | POA: Diagnosis not present

## 2022-01-12 MED ORDER — OLMESARTAN-AMLODIPINE-HCTZ 40-10-25 MG PO TABS: 1.0000 | ORAL_TABLET | Freq: Every day | ORAL | 0 refills | Status: DC

## 2022-01-12 NOTE — Patient Instructions (Signed)

## 2022-01-12 NOTE — Progress Notes (Signed)
Subjective:    Patient ID: Sara Andrade, female    DOB: September 22, 1980, 41 y.o.   MRN: [id number]  HPI  Patient presents to clinic today for her annual exam. Of note, her BP today is 176/110. She was seen at [facility] 2 weeks ago for the same, Olmesartan was increased to 40 mg daily.  Flu: 06/2021 Tetanus: 03/2015 COVID: [employer] x2 Pap smear: Hysterectomy Mammogram: Never Vision screening: as needed Dentist: as needed  Diet: She does eat meat. She consumes some fruits and veggies. She does eat some fried foods. She drinks mostly coffee. Exercise: None  Review of Systems     Past Medical History:  Diagnosis Date   Anxiety    Depression    GERD (gastroesophageal reflux disease)    Hypertension     Current Outpatient Medications  Medication Sig Dispense Refill   clonazePAM (KLONOPIN) 0.5 MG tablet Take 0.5 mg by mouth daily as needed.     cyclobenzaprine (FLEXERIL) 5 MG tablet TAKE ONE TABLET BY MOUTH EVERY NIGHT AT BEDTIME AS NEEDED FOR MUSCLE SPASMS 20 tablet 0   fluvoxaMINE (LUVOX) 100 MG tablet Take 100 mg by mouth 2 (two) times daily.     gabapentin (NEURONTIN) 300 MG capsule Take 300 mg by mouth 3 (three) times daily.     olmesartan (BENICAR) 40 MG tablet Take 40 mg by mouth daily.     omeprazole (PRILOSEC) 40 MG capsule Take 40 mg by mouth daily.     REXULTI 2 MG TABS tablet Take 2 mg by mouth daily.     Vitamin D, Ergocalciferol, (DRISDOL) 1.25 MG (50000 UNIT) CAPS capsule Take 50,000 Units by mouth once a week.     No current facility-administered medications for this visit.    Allergies  Allergen Reactions   Bee Venom Rash   Escitalopram Other (See Comments)   Lactose Diarrhea    Gas    Prednisone     Other reaction(s): Unknown   Sertraline Diarrhea    Other reaction(s): Other (See Comments), Other (See Comments) Emotional numbness Emotional numbness    Lisinopril Cough and Other (See Comments)    cough cough     No family history on file.  Social  History   Socioeconomic History   Marital status: Married    Spouse name: Not on file   Number of children: Not on file   Years of education: Not on file   Highest education level: Not on file  Occupational History   Not on file  Tobacco Use   Smoking status: Former    Packs/day: 1.00    Years: 3.00    Total pack years: 3.00    Types: Cigarettes    Quit date: 2012    Years since quitting: 11.4   Smokeless tobacco: Never  Vaping Use   Vaping Use: Never used  Substance and Sexual Activity   Alcohol use: Yes   Drug use: Never   Sexual activity: Not on file  Other Topics Concern   Not on file  Social History Narrative   Not on file   Social Determinants of Health   Financial Resource Strain: Not on file  Food Insecurity: Not on file  Transportation Needs: Not on file  Physical Activity: Not on file  Stress: Not on file  Social Connections: Not on file  Intimate Partner Violence: Not on file     Constitutional: Denies fever, malaise, fatigue, headache or abrupt weight changes.  HEENT: Denies eye pain, eye redness, ear  pain, ringing in the ears, wax buildup, runny nose, nasal congestion, bloody nose, or sore throat. Respiratory: Denies difficulty breathing, shortness of breath, cough or sputum production.   Cardiovascular: Denies chest pain, chest tightness, palpitations or swelling in the hands or feet.  Gastrointestinal: Patient reports diarrhea, reflux.  Denies abdominal pain, bloating, diarrhea or blood in the stool.  GU: Denies urgency, frequency, pain with urination, burning sensation, blood in urine, odor or discharge. Musculoskeletal: Patient reports chronic muscle and joint pain.  Denies decrease in range of motion, difficulty with gait, or joint swelling.  Skin: Denies redness, rashes, lesions or ulcercations.  Neurological: Patient reports inattention.  Denies dizziness, difficulty with memory, difficulty with speech or problems with balance and coordination.   Psych: Patient has a history of anxiety and depression.  Denies SI/HI.  No other specific complaints in a complete review of systems (except as listed in HPI above).  Objective:   Physical Exam BP (!) 159/100 (BP Location: Left Arm, Patient Position: Sitting, Cuff Size: Normal)   Pulse 75   Temp 97.8 F (36.6 C) (Oral)   Ht '5\' 2"'$  (1.575 m)   Wt 217 lb (98.4 kg)   SpO2 95%   BMI 39.69 kg/m   Wt Readings from Last 3 Encounters:  11/05/21 207 lb 3.2 oz (94 kg)  07/13/21 198 lb (89.8 kg)  06/23/21 197 lb (89.4 kg)    General: Appears her stated age, obese, in NAD. Skin: Warm, dry and intact. Eczema noted of hands HEENT: Head: normal shape and size; Eyes: sclera white, no icterus, conjunctiva pink, PERRLA and EOMs intact;  Neck:  Neck supple, trachea midline. No masses, lumps or thyromegaly present.  Cardiovascular: Normal rate and rhythm. S1,S2 noted.  No murmur, rubs or gallops noted. No JVD or BLE edema.  Pulmonary/Chest: Normal effort and positive vesicular breath sounds. No respiratory distress. No wheezes, rales or ronchi noted.  Abdomen: Soft and nontender. Normal bowel sounds.  Musculoskeletal: Strength 5/5 BUE/BLE. No difficulty with gait.  Neurological: Alert and oriented. Cranial nerves II-XII grossly intact. Coordination normal.  Psychiatric: Mood and affect normal. Behavior is normal. Judgment and thought content normal.        Assessment & Plan:   Preventative Health Maintenance:  Encouraged her to get a flu shot the fall Tetanus UTD Encouraged her to get a COVID booster She no longer needs Pap smears She will start screening mammograms at 43 Encouraged her to consume a balanced diet and exercise regimen Advised her to see an eye doctor and dentist annually She will bring me labs reviewed from 2 weeks  RTC in 2 weeks follow up HTN, 6 months, follow-up chronic conditions  Webb Silversmith, NP

## 2022-01-12 NOTE — Assessment & Plan Note (Signed)
Encouraged diet and exercise for weight loss ?

## 2022-01-12 NOTE — Assessment & Plan Note (Signed)
Uncontrolled on Olmesartan, will d/c RX for Amlodipine- Olmesartan- HCTZ Reinforced DASH diet and exercise for weight loss  RTC in 2 weeks for BP check

## 2022-01-21 ENCOUNTER — Other Ambulatory Visit: Payer: Self-pay | Admitting: Internal Medicine

## 2022-01-21 NOTE — Telephone Encounter (Signed)
dc'd 01/03/22 Ernesta Amble CMA  Requested Prescriptions  Refused Prescriptions Disp Refills  . omeprazole (PRILOSEC) 20 MG capsule [Pharmacy Med Name: OMEPRAZOLE DR 20 MG CAPSULE] 90 capsule 1    Sig: TAKE ONE CAPSULE BY MOUTH DAILY     Gastroenterology: Proton Pump Inhibitors Passed - 01/21/2022  6:23 AM      Passed - Valid encounter within last 12 months    Recent Outpatient Visits          1 week ago Encounter for general adult medical examination with abnormal findings   [facility] [place], Coralie Keens, NP   6 months ago Chronic fatigue   [facility] [place], [place], [place]   7 months ago Chronic pain of both shoulders   [facility] [place], Coralie Keens, NP   11 months ago Primary hypertension   [facility] [place], Coralie Keens, NP   1 year ago Primary hypertension   [facility] [place], Coralie Keens, NP      Future Appointments            In 1 week Garnette Gunner, Coralie Keens, NP [facility], [facility]

## 2022-01-23 ENCOUNTER — Encounter: Payer: Self-pay | Admitting: Internal Medicine

## 2022-01-25 DIAGNOSIS — F909 Attention-deficit hyperactivity disorder, unspecified type: Secondary | ICD-10-CM | POA: Diagnosis not present

## 2022-01-25 DIAGNOSIS — F411 Generalized anxiety disorder: Secondary | ICD-10-CM | POA: Diagnosis not present

## 2022-01-25 DIAGNOSIS — F4310 Post-traumatic stress disorder, unspecified: Secondary | ICD-10-CM | POA: Diagnosis not present

## 2022-01-25 DIAGNOSIS — R69 Illness, unspecified: Secondary | ICD-10-CM | POA: Diagnosis not present

## 2022-02-02 ENCOUNTER — Ambulatory Visit: Payer: 59 | Admitting: Internal Medicine

## 2022-02-02 NOTE — Progress Notes (Deleted)
Subjective:    Patient ID: Sara Andrade, female    DOB: 1981/03/08, 41 y.o.   MRN: [id number]  HPI  Pt presents to the clinic today for 2 week follow up of HTN. At her last visit, her Olmesartan was d/c'd and she was started on Amlodipine-Olmesartan-HCTZ. She has been taking the medication as prescribed. Her BP today is. There is no ECG on file.  Review of Systems     Past Medical History:  Diagnosis Date   Anxiety    Depression    GERD (gastroesophageal reflux disease)    Hypertension     Current Outpatient Medications  Medication Sig Dispense Refill   clonazePAM (KLONOPIN) 0.5 MG tablet Take 0.5 mg by mouth daily as needed.     cyclobenzaprine (FLEXERIL) 5 MG tablet TAKE ONE TABLET BY MOUTH EVERY NIGHT AT BEDTIME AS NEEDED FOR MUSCLE SPASMS 20 tablet 0   fluvoxaMINE (LUVOX) 100 MG tablet Take 100 mg by mouth 2 (two) times daily.     gabapentin (NEURONTIN) 300 MG capsule Take 300 mg by mouth 3 (three) times daily.     Olmesartan-amLODIPine-HCTZ 40-10-25 MG TABS Take 1 tablet by mouth daily. 30 tablet 0   omeprazole (PRILOSEC) 40 MG capsule Take 40 mg by mouth daily.     REXULTI 2 MG TABS tablet Take 2 mg by mouth daily.     Vitamin D, Ergocalciferol, (DRISDOL) 1.25 MG (50000 UNIT) CAPS capsule Take 50,000 Units by mouth once a week.     No current facility-administered medications for this visit.    Allergies  Allergen Reactions   Bee Venom Rash   Escitalopram Other (See Comments)   Lactose Diarrhea    Gas    Prednisone     Other reaction(s): Unknown   Sertraline Diarrhea    Other reaction(s): Other (See Comments), Other (See Comments) Emotional numbness Emotional numbness    Lisinopril Cough and Other (See Comments)    cough cough     Family History  Adopted: Yes    Social History   Socioeconomic History   Marital status: Married    Spouse name: Not on file   Number of children: Not on file   Years of education: Not on file   Highest education  level: Not on file  Occupational History   Not on file  Tobacco Use   Smoking status: Former    Packs/day: 1.00    Years: 3.00    Total pack years: 3.00    Types: Cigarettes    Quit date: 2012    Years since quitting: 11.5   Smokeless tobacco: Never  Vaping Use   Vaping Use: Never used  Substance and Sexual Activity   Alcohol use: Yes   Drug use: Never   Sexual activity: Not on file  Other Topics Concern   Not on file  Social History Narrative   Not on file   Social Determinants of Health   Financial Resource Strain: Not on file  Food Insecurity: Not on file  Transportation Needs: Not on file  Physical Activity: Not on file  Stress: Not on file  Social Connections: Not on file  Intimate Partner Violence: Not on file     Constitutional: Denies fever, malaise, fatigue, headache or abrupt weight changes.  HEENT: Denies eye pain, eye redness, ear pain, ringing in the ears, wax buildup, runny nose, nasal congestion, bloody nose, or sore throat. Respiratory: Denies difficulty breathing, shortness of breath, cough or sputum production.   Cardiovascular: Denies  chest pain, chest tightness, palpitations or swelling in the hands or feet.  Gastrointestinal: Denies abdominal pain, bloating, constipation, diarrhea or blood in the stool.  GU: Denies urgency, frequency, pain with urination, burning sensation, blood in urine, odor or discharge. Musculoskeletal: Denies decrease in range of motion, difficulty with gait, muscle pain or joint pain and swelling.  Skin: Denies redness, rashes, lesions or ulcercations.  Neurological: Denies dizziness, difficulty with memory, difficulty with speech or problems with balance and coordination.  Psych: Denies anxiety, depression, SI/HI.  No other specific complaints in a complete review of systems (except as listed in HPI above).  Objective:   Physical Exam  There were no vitals taken for this visit. Wt Readings from Last 3 Encounters:   01/12/22 217 lb (98.4 kg)  11/05/21 207 lb 3.2 oz (94 kg)  07/13/21 198 lb (89.8 kg)    General: Appears their stated age, well developed, well nourished in NAD. Skin: Warm, dry and intact. No rashes, lesions or ulcerations noted. HEENT: Head: normal shape and size; Eyes: sclera white, no icterus, conjunctiva pink, PERRLA and EOMs intact; Ears: Tm's gray and intact, normal light reflex; Nose: mucosa pink and moist, septum midline; Throat/Mouth: Teeth present, mucosa pink and moist, no exudate, lesions or ulcerations noted.  Neck:  Neck supple, trachea midline. No masses, lumps or thyromegaly present.  Cardiovascular: Normal rate and rhythm. S1,S2 noted.  No murmur, rubs or gallops noted. No JVD or BLE edema. No carotid bruits noted. Pulmonary/Chest: Normal effort and positive vesicular breath sounds. No respiratory distress. No wheezes, rales or ronchi noted.  Abdomen: Soft and nontender. Normal bowel sounds. No distention or masses noted. Liver, spleen and kidneys non palpable. Musculoskeletal: Normal range of motion. No signs of joint swelling. No difficulty with gait.  Neurological: Alert and oriented. Cranial nerves II-XII grossly intact. Coordination normal.  Psychiatric: Mood and affect normal. Behavior is normal. Judgment and thought content normal.           Assessment & Plan:    RTC in 5 months, follow up chronic conditions Webb Silversmith, NP

## 2022-02-10 ENCOUNTER — Other Ambulatory Visit: Payer: Self-pay | Admitting: Internal Medicine

## 2022-02-11 NOTE — Telephone Encounter (Signed)
Requested medication (s) are due for refill today:   Yes  Requested medication (s) are on the active medication list:   Yes  Future visit scheduled:   Yes     Last ordered: 01/12/2022 #30, 0 refills  Returned because labs are due per protocol.     Requested Prescriptions  Pending Prescriptions Disp Refills   Olmesartan-amLODIPine-HCTZ 40-10-25 MG TABS [Pharmacy Med Name: OLMSRTN-AMLDPN-HCTZ 40-10-25MG] 30 tablet 0    Sig: TAKE 1 TABLET BY MOUTH EVERY DAY     Cardiovascular: CCB + ARB + Diuretic Combos Failed - 02/10/2022 12:33 PM      Failed - K in normal range and within 180 days    No results found for: "K", "POTASSIUM", "POCK"       Failed - Na in normal range and within 180 days    No results found for: "NA", "POCNA"       Failed - Cr in normal range and within 180 days    No results found for: "CREATININE", "LABCREAU", "LABCREA", "POCCRE"       Failed - eGFR is 10 or above and within 180 days    No results found for: "GFRAA", "GFRNONAA", "GFR", "EGFR"       Failed - Last BP in normal range    BP Readings from Last 1 Encounters:  01/12/22 (!) 159/100         Passed - Patient is not pregnant      Passed - Last Heart Rate in normal range    Pulse Readings from Last 1 Encounters:  01/12/22 75         Passed - Valid encounter within last 6 months    Recent Outpatient Visits           1 month ago Encounter for general adult medical examination with abnormal findings   [facility] [place], Coralie Keens, NP   7 months ago Chronic fatigue   [facility] [place], [place], [place]   7 months ago Chronic pain of both shoulders   [facility], Coralie Keens, NP   1 year ago Primary hypertension   [facility] [place], Coralie Keens, NP   1 year ago Primary hypertension   [facility] [place], Coralie Keens, NP       Future Appointments             In 1 week Garnette Gunner, Coralie Keens, NP [facility]

## 2022-02-24 ENCOUNTER — Ambulatory Visit (INDEPENDENT_AMBULATORY_CARE_PROVIDER_SITE_OTHER): Payer: 59 | Admitting: Internal Medicine

## 2022-02-24 ENCOUNTER — Encounter: Payer: Self-pay | Admitting: Internal Medicine

## 2022-02-24 VITALS — BP 124/84 | HR 95 | Temp 96.8°F | Wt 218.0 lb

## 2022-02-24 DIAGNOSIS — Z6839 Body mass index (BMI) 39.0-39.9, adult: Secondary | ICD-10-CM | POA: Diagnosis not present

## 2022-02-24 DIAGNOSIS — M79602 Pain in left arm: Secondary | ICD-10-CM | POA: Diagnosis not present

## 2022-02-24 DIAGNOSIS — I10 Essential (primary) hypertension: Secondary | ICD-10-CM | POA: Diagnosis not present

## 2022-02-24 DIAGNOSIS — R202 Paresthesia of skin: Secondary | ICD-10-CM | POA: Diagnosis not present

## 2022-02-24 DIAGNOSIS — M79601 Pain in right arm: Secondary | ICD-10-CM

## 2022-02-24 DIAGNOSIS — E785 Hyperlipidemia, unspecified: Secondary | ICD-10-CM | POA: Insufficient documentation

## 2022-02-24 DIAGNOSIS — R7303 Prediabetes: Secondary | ICD-10-CM | POA: Insufficient documentation

## 2022-02-24 MED ORDER — OMEPRAZOLE 40 MG PO CPDR: 40.0000 mg | DELAYED_RELEASE_CAPSULE | Freq: Every day | ORAL | 1 refills | Status: DC

## 2022-02-24 MED ORDER — OLMESARTAN-AMLODIPINE-HCTZ 20-5-12.5 MG PO TABS: 1.0000 | ORAL_TABLET | Freq: Every day | ORAL | 0 refills | Status: DC

## 2022-02-24 NOTE — Assessment & Plan Note (Signed)
Encourage diet and exercise for weight loss 

## 2022-02-24 NOTE — Assessment & Plan Note (Signed)
She reports low blood pressure Will decrease olmesartan-amlodipine-HCTZ to 20-5-12.5 mg daily Reinforced DASH diet and exercise for weight loss

## 2022-02-24 NOTE — Progress Notes (Signed)
Subjective:    Patient ID: Sara Andrade, female    DOB: Mar 18, 1981, 41 y.o.   MRN: [id number]  HPI  Patient presents to clinic today for follow up HTN. Her BP today is 126/84. She reports she forgot to take her blood pressure medication today. Her blood pressure at home is 90/50's. She reports numbness and tingling in her upper extremities and thinks this is coming from the low blood pressure.  She reports numbness and tingling in her hands seems worse when she is holding her phone.  Sometimes she has to put her phone down and shake her hands to get the blood flowing.  Review of Systems     Past Medical History:  Diagnosis Date   Anxiety    Depression    GERD (gastroesophageal reflux disease)    Hypertension     Current Outpatient Medications  Medication Sig Dispense Refill   clonazePAM (KLONOPIN) 0.5 MG tablet Take 0.5 mg by mouth daily as needed.     cyclobenzaprine (FLEXERIL) 5 MG tablet TAKE ONE TABLET BY MOUTH EVERY NIGHT AT BEDTIME AS NEEDED FOR MUSCLE SPASMS 20 tablet 0   fluvoxaMINE (LUVOX) 100 MG tablet Take 100 mg by mouth 2 (two) times daily.     gabapentin (NEURONTIN) 300 MG capsule Take 300 mg by mouth 3 (three) times daily.     Olmesartan-amLODIPine-HCTZ 40-10-25 MG TABS TAKE 1 TABLET BY MOUTH EVERY DAY 90 tablet 0   omeprazole (PRILOSEC) 40 MG capsule Take 40 mg by mouth daily.     REXULTI 2 MG TABS tablet Take 2 mg by mouth daily.     Vitamin D, Ergocalciferol, (DRISDOL) 1.25 MG (50000 UNIT) CAPS capsule Take 50,000 Units by mouth once a week.     No current facility-administered medications for this visit.    Allergies  Allergen Reactions   Bee Venom Rash   Escitalopram Other (See Comments)   Lactose Diarrhea    Gas    Prednisone     Other reaction(s): Unknown   Sertraline Diarrhea    Other reaction(s): Other (See Comments), Other (See Comments) Emotional numbness Emotional numbness    Lisinopril Cough and Other (See Comments)    cough cough      Family History  Adopted: Yes    Social History   Socioeconomic History   Marital status: Married    Spouse name: Not on file   Number of children: Not on file   Years of education: Not on file   Highest education level: Not on file  Occupational History   Not on file  Tobacco Use   Smoking status: Former    Packs/day: 1.00    Years: 3.00    Total pack years: 3.00    Types: Cigarettes    Quit date: 2012    Years since quitting: 11.6   Smokeless tobacco: Never  Vaping Use   Vaping Use: Never used  Substance and Sexual Activity   Alcohol use: Yes   Drug use: Never   Sexual activity: Not on file  Other Topics Concern   Not on file  Social History Narrative   Not on file   Social Determinants of Health   Financial Resource Strain: Not on file  Food Insecurity: Not on file  Transportation Needs: Not on file  Physical Activity: Not on file  Stress: Not on file  Social Connections: Not on file  Intimate Partner Violence: Not on file     Constitutional: Denies fever, malaise, fatigue, headache or  abrupt weight changes.  Respiratory: Denies difficulty breathing, shortness of breath, cough or sputum production.   Cardiovascular: Denies chest pain, chest tightness, palpitations or swelling in the hands or feet.  Musculoskeletal: Denies decrease in range of motion, difficulty with gait, muscle pain or joint pain and swelling.  Skin: Denies redness, rashes, lesions or ulcercations.  Neurological: Patient reports numbness and tingling in hands.  Denies dizziness, difficulty with memory, difficulty with speech or problems with balance and coordination.    No other specific complaints in a complete review of systems (except as listed in HPI above).  Objective:   Physical Exam  BP 124/84 (BP Location: Right Arm, Patient Position: Sitting, Cuff Size: Normal)   Pulse 95   Temp (!) 96.8 F (36 C) (Temporal)   Wt 218 lb (98.9 kg)   SpO2 97%   BMI 39.87 kg/m   Wt  Readings from Last 3 Encounters:  01/12/22 217 lb (98.4 kg)  11/05/21 207 lb 3.2 oz (94 kg)  07/13/21 198 lb (89.8 kg)    General: Appears her stated age, obese, in NAD. Cardiovascular: Normal rate and rhythm. S1,S2 noted.  No murmur, rubs or gallops noted.  Trace BLE edema.  Radial pulses 2+ bilaterally. Pulmonary/Chest: Normal effort and positive vesicular breath sounds. No respiratory distress. No wheezes, rales or ronchi noted.  Musculoskeletal: No difficulty with gait.  Neurological: Alert and oriented.  Positive Tinel's on the left.  Negative Phalen's.      Assessment & Plan:  Paresthesia of Upper Extremities:  Likely more carpal tunnel than related to blood pressure Can use cock-up splints as needed Let me know if symptoms persist or worsen  RTC in 4 months for follow-up of chronic conditions Webb Silversmith, NP

## 2022-02-24 NOTE — Patient Instructions (Signed)
Carpal Tunnel Syndrome  Carpal tunnel syndrome is a condition that causes pain, weakness, and numbness in your hand and arm. Numbness is when you cannot feel an area in your body. The carpal tunnel is a narrow area that is on the palm side of your wrist. Repeated wrist motion or certain diseases may cause swelling in the tunnel. This swelling can pinch the main nerve in the wrist. This nerve is called the median nerve. What are the causes? This condition may be caused by: Moving your hand and wrist over and over again while doing a task. Injury to the wrist. Arthritis. A sac of fluid (cyst) or abnormal growth (tumor) in the carpal tunnel. Fluid buildup during pregnancy. Use of tools that vibrate. Sometimes the cause is not known. What increases the risk? The following factors may make you more likely to have this condition: Having a job that makes you do these things: Move your hand over and over again. Work with tools that vibrate, such as drills or sanders. Being a woman. Having diabetes, obesity, thyroid problems, or kidney failure. What are the signs or symptoms? Symptoms of this condition include: A tingling feeling in your fingers. Tingling or loss of feeling in your hand. Pain in your entire arm. This pain may get worse when you bend your wrist and elbow for a long time. Pain in your wrist that goes up your arm to your shoulder. Pain that goes down into your palm or fingers. Weakness in your hands. You may find it hard to grab and hold items. You may feel worse at night. How is this treated? This condition may be treated with: Lifestyle changes. You will be asked to stop or change the activity that caused your problem. Doing exercises and activities that make bones, muscles, and tendons stronger (physical therapy). Learning how to use your hand again (occupational therapy). Medicines for pain and swelling. You may have injections in your wrist. A wrist splint or  brace. Surgery. Follow these instructions at home: If you have a splint or brace: Wear the splint or brace as told by your doctor. Take it off only as told by your doctor. Loosen the splint if your fingers: Tingle. Become numb. Turn cold and blue. Keep the splint or brace clean. If the splint or brace is not waterproof: Do not let it get wet. Cover it with a watertight covering when you take a bath or a shower. Managing pain, stiffness, and swelling If told, put ice on the painful area: If you have a removable splint or brace, remove it as told by your doctor. Put ice in a plastic bag. Place a towel between your skin and the bag. Leave the ice on for 20 minutes, 2-3 times per day. Do not fall asleep with the cold pack on your skin. Take off the ice if your skin turns bright red. This is very important. If you cannot feel pain, heat, or cold, you have a greater risk of damage to the area. Move your fingers often to reduce stiffness and swelling. General instructions Take over-the-counter and prescription medicines only as told by your doctor. Rest your wrist from any activity that may cause pain. If needed, talk with your boss at work about changes that can help your wrist heal. Do exercises as told by your doctor, physical therapist, or occupational therapist. Keep all follow-up visits. Contact a doctor if: You have new symptoms. Medicine does not help your pain. Your symptoms get worse. Get help right  away if: You have very bad numbness or tingling in your wrist or hand. Summary Carpal tunnel syndrome is a condition that causes pain in your hand and arm. It is often caused by repeated wrist motions. Lifestyle changes and medicines are used to treat this problem. Surgery may help in very bad cases. Follow your doctor's instructions about wearing a splint, resting your wrist, keeping follow-up visits, and calling for help. This information is not intended to replace advice given  to you by your health care provider. Make sure you discuss any questions you have with your health care provider. Document Revised: 11/14/2019 Document Reviewed: 11/14/2019 Elsevier Patient Education  2023 Elsevier Inc.  

## 2022-03-01 DIAGNOSIS — R69 Illness, unspecified: Secondary | ICD-10-CM | POA: Diagnosis not present

## 2022-03-01 DIAGNOSIS — F909 Attention-deficit hyperactivity disorder, unspecified type: Secondary | ICD-10-CM | POA: Diagnosis not present

## 2022-03-01 DIAGNOSIS — F4310 Post-traumatic stress disorder, unspecified: Secondary | ICD-10-CM | POA: Diagnosis not present

## 2022-03-01 DIAGNOSIS — F411 Generalized anxiety disorder: Secondary | ICD-10-CM | POA: Diagnosis not present

## 2022-03-18 ENCOUNTER — Institutional Professional Consult (permissible substitution): Payer: 59 | Admitting: Plastic Surgery

## 2022-03-18 DIAGNOSIS — M47812 Spondylosis without myelopathy or radiculopathy, cervical region: Secondary | ICD-10-CM | POA: Diagnosis not present

## 2022-03-18 DIAGNOSIS — G4733 Obstructive sleep apnea (adult) (pediatric): Secondary | ICD-10-CM | POA: Diagnosis not present

## 2022-03-18 DIAGNOSIS — E559 Vitamin D deficiency, unspecified: Secondary | ICD-10-CM | POA: Diagnosis not present

## 2022-03-18 DIAGNOSIS — M5416 Radiculopathy, lumbar region: Secondary | ICD-10-CM | POA: Diagnosis not present

## 2022-03-18 DIAGNOSIS — M797 Fibromyalgia: Secondary | ICD-10-CM | POA: Diagnosis not present

## 2022-03-29 DIAGNOSIS — F411 Generalized anxiety disorder: Secondary | ICD-10-CM | POA: Diagnosis not present

## 2022-03-29 DIAGNOSIS — F909 Attention-deficit hyperactivity disorder, unspecified type: Secondary | ICD-10-CM | POA: Diagnosis not present

## 2022-03-29 DIAGNOSIS — R69 Illness, unspecified: Secondary | ICD-10-CM | POA: Diagnosis not present

## 2022-03-29 DIAGNOSIS — F4310 Post-traumatic stress disorder, unspecified: Secondary | ICD-10-CM | POA: Diagnosis not present

## 2022-04-13 DIAGNOSIS — F339 Major depressive disorder, recurrent, unspecified: Secondary | ICD-10-CM | POA: Diagnosis not present

## 2022-04-13 DIAGNOSIS — F909 Attention-deficit hyperactivity disorder, unspecified type: Secondary | ICD-10-CM | POA: Diagnosis not present

## 2022-04-13 DIAGNOSIS — R69 Illness, unspecified: Secondary | ICD-10-CM | POA: Diagnosis not present

## 2022-04-13 DIAGNOSIS — F4312 Post-traumatic stress disorder, chronic: Secondary | ICD-10-CM | POA: Diagnosis not present

## 2022-04-13 DIAGNOSIS — F4000 Agoraphobia, unspecified: Secondary | ICD-10-CM | POA: Diagnosis not present

## 2022-04-17 DIAGNOSIS — G4733 Obstructive sleep apnea (adult) (pediatric): Secondary | ICD-10-CM | POA: Diagnosis not present

## 2022-04-18 DIAGNOSIS — F909 Attention-deficit hyperactivity disorder, unspecified type: Secondary | ICD-10-CM | POA: Diagnosis not present

## 2022-04-18 DIAGNOSIS — F4310 Post-traumatic stress disorder, unspecified: Secondary | ICD-10-CM | POA: Diagnosis not present

## 2022-04-18 DIAGNOSIS — F411 Generalized anxiety disorder: Secondary | ICD-10-CM | POA: Diagnosis not present

## 2022-04-18 DIAGNOSIS — R69 Illness, unspecified: Secondary | ICD-10-CM | POA: Diagnosis not present

## 2022-04-18 DIAGNOSIS — F339 Major depressive disorder, recurrent, unspecified: Secondary | ICD-10-CM | POA: Diagnosis not present

## 2022-04-18 DIAGNOSIS — F4312 Post-traumatic stress disorder, chronic: Secondary | ICD-10-CM | POA: Diagnosis not present

## 2022-04-18 DIAGNOSIS — F4000 Agoraphobia, unspecified: Secondary | ICD-10-CM | POA: Diagnosis not present

## 2022-04-27 DIAGNOSIS — R69 Illness, unspecified: Secondary | ICD-10-CM | POA: Diagnosis not present

## 2022-04-27 DIAGNOSIS — F339 Major depressive disorder, recurrent, unspecified: Secondary | ICD-10-CM | POA: Diagnosis not present

## 2022-04-27 DIAGNOSIS — F909 Attention-deficit hyperactivity disorder, unspecified type: Secondary | ICD-10-CM | POA: Diagnosis not present

## 2022-04-27 DIAGNOSIS — F4000 Agoraphobia, unspecified: Secondary | ICD-10-CM | POA: Diagnosis not present

## 2022-04-27 DIAGNOSIS — F4312 Post-traumatic stress disorder, chronic: Secondary | ICD-10-CM | POA: Diagnosis not present

## 2022-05-04 DIAGNOSIS — F4000 Agoraphobia, unspecified: Secondary | ICD-10-CM | POA: Diagnosis not present

## 2022-05-04 DIAGNOSIS — F909 Attention-deficit hyperactivity disorder, unspecified type: Secondary | ICD-10-CM | POA: Diagnosis not present

## 2022-05-04 DIAGNOSIS — F339 Major depressive disorder, recurrent, unspecified: Secondary | ICD-10-CM | POA: Diagnosis not present

## 2022-05-04 DIAGNOSIS — R69 Illness, unspecified: Secondary | ICD-10-CM | POA: Diagnosis not present

## 2022-05-04 DIAGNOSIS — F4312 Post-traumatic stress disorder, chronic: Secondary | ICD-10-CM | POA: Diagnosis not present

## 2022-05-09 DIAGNOSIS — R69 Illness, unspecified: Secondary | ICD-10-CM | POA: Diagnosis not present

## 2022-05-09 DIAGNOSIS — F4310 Post-traumatic stress disorder, unspecified: Secondary | ICD-10-CM | POA: Diagnosis not present

## 2022-05-09 DIAGNOSIS — F411 Generalized anxiety disorder: Secondary | ICD-10-CM | POA: Diagnosis not present

## 2022-05-09 DIAGNOSIS — F909 Attention-deficit hyperactivity disorder, unspecified type: Secondary | ICD-10-CM | POA: Diagnosis not present

## 2022-05-11 DIAGNOSIS — F339 Major depressive disorder, recurrent, unspecified: Secondary | ICD-10-CM | POA: Diagnosis not present

## 2022-05-11 DIAGNOSIS — R69 Illness, unspecified: Secondary | ICD-10-CM | POA: Diagnosis not present

## 2022-05-11 DIAGNOSIS — F4000 Agoraphobia, unspecified: Secondary | ICD-10-CM | POA: Diagnosis not present

## 2022-05-11 DIAGNOSIS — F4312 Post-traumatic stress disorder, chronic: Secondary | ICD-10-CM | POA: Diagnosis not present

## 2022-05-11 DIAGNOSIS — F909 Attention-deficit hyperactivity disorder, unspecified type: Secondary | ICD-10-CM | POA: Diagnosis not present

## 2022-05-16 ENCOUNTER — Ambulatory Visit: Payer: 59 | Admitting: Internal Medicine

## 2022-05-18 DIAGNOSIS — M5136 Other intervertebral disc degeneration, lumbar region: Secondary | ICD-10-CM | POA: Diagnosis not present

## 2022-05-18 DIAGNOSIS — M25551 Pain in right hip: Secondary | ICD-10-CM | POA: Diagnosis not present

## 2022-05-18 DIAGNOSIS — M5030 Other cervical disc degeneration, unspecified cervical region: Secondary | ICD-10-CM | POA: Diagnosis not present

## 2022-05-18 DIAGNOSIS — M5412 Radiculopathy, cervical region: Secondary | ICD-10-CM | POA: Diagnosis not present

## 2022-05-18 DIAGNOSIS — F4312 Post-traumatic stress disorder, chronic: Secondary | ICD-10-CM | POA: Diagnosis not present

## 2022-05-18 DIAGNOSIS — G8929 Other chronic pain: Secondary | ICD-10-CM | POA: Diagnosis not present

## 2022-05-18 DIAGNOSIS — F4000 Agoraphobia, unspecified: Secondary | ICD-10-CM | POA: Diagnosis not present

## 2022-05-18 DIAGNOSIS — F339 Major depressive disorder, recurrent, unspecified: Secondary | ICD-10-CM | POA: Diagnosis not present

## 2022-05-18 DIAGNOSIS — M545 Low back pain, unspecified: Secondary | ICD-10-CM | POA: Diagnosis not present

## 2022-05-18 DIAGNOSIS — M5416 Radiculopathy, lumbar region: Secondary | ICD-10-CM | POA: Diagnosis not present

## 2022-05-18 DIAGNOSIS — G4733 Obstructive sleep apnea (adult) (pediatric): Secondary | ICD-10-CM | POA: Diagnosis not present

## 2022-05-18 DIAGNOSIS — M4802 Spinal stenosis, cervical region: Secondary | ICD-10-CM | POA: Diagnosis not present

## 2022-05-18 DIAGNOSIS — M25552 Pain in left hip: Secondary | ICD-10-CM | POA: Diagnosis not present

## 2022-05-18 DIAGNOSIS — F909 Attention-deficit hyperactivity disorder, unspecified type: Secondary | ICD-10-CM | POA: Diagnosis not present

## 2022-05-18 DIAGNOSIS — R69 Illness, unspecified: Secondary | ICD-10-CM | POA: Diagnosis not present

## 2022-05-25 DIAGNOSIS — F909 Attention-deficit hyperactivity disorder, unspecified type: Secondary | ICD-10-CM | POA: Diagnosis not present

## 2022-05-25 DIAGNOSIS — F4000 Agoraphobia, unspecified: Secondary | ICD-10-CM | POA: Diagnosis not present

## 2022-05-25 DIAGNOSIS — F4312 Post-traumatic stress disorder, chronic: Secondary | ICD-10-CM | POA: Diagnosis not present

## 2022-05-25 DIAGNOSIS — R69 Illness, unspecified: Secondary | ICD-10-CM | POA: Diagnosis not present

## 2022-05-25 DIAGNOSIS — F339 Major depressive disorder, recurrent, unspecified: Secondary | ICD-10-CM | POA: Diagnosis not present

## 2022-05-26 ENCOUNTER — Telehealth: Payer: Self-pay

## 2022-05-26 ENCOUNTER — Ambulatory Visit: Payer: 59 | Admitting: Internal Medicine

## 2022-05-26 NOTE — Telephone Encounter (Signed)
Please review.   I'm not sure what labs she is due for.    Thanks,   -Mickel Baas

## 2022-05-26 NOTE — Telephone Encounter (Signed)
Room not aware that she needs any labs.  She is due for follow-up in December

## 2022-05-26 NOTE — Telephone Encounter (Signed)
Copied from [id number]. Topic: General - Inquiry >> May 25, 2022  3:44 PM Leilani Able wrote: Reason for CRM: Pt is calling to make a lab appt as she thought Rollene Fare had ordered a panel of labs for her. She states was told did not need an appt with dr just a lab appt. Please call pt to advise if ordered at [phone] Aug 23 last ov

## 2022-05-27 NOTE — Telephone Encounter (Signed)
Left message advising pt.   Thanks,   -Talor Desrosiers  

## 2022-06-01 DIAGNOSIS — F4000 Agoraphobia, unspecified: Secondary | ICD-10-CM | POA: Diagnosis not present

## 2022-06-01 DIAGNOSIS — R69 Illness, unspecified: Secondary | ICD-10-CM | POA: Diagnosis not present

## 2022-06-01 DIAGNOSIS — F4312 Post-traumatic stress disorder, chronic: Secondary | ICD-10-CM | POA: Diagnosis not present

## 2022-06-01 DIAGNOSIS — F339 Major depressive disorder, recurrent, unspecified: Secondary | ICD-10-CM | POA: Diagnosis not present

## 2022-06-01 DIAGNOSIS — F909 Attention-deficit hyperactivity disorder, unspecified type: Secondary | ICD-10-CM | POA: Diagnosis not present

## 2022-06-06 DIAGNOSIS — F909 Attention-deficit hyperactivity disorder, unspecified type: Secondary | ICD-10-CM | POA: Diagnosis not present

## 2022-06-06 DIAGNOSIS — R69 Illness, unspecified: Secondary | ICD-10-CM | POA: Diagnosis not present

## 2022-06-06 DIAGNOSIS — F4310 Post-traumatic stress disorder, unspecified: Secondary | ICD-10-CM | POA: Diagnosis not present

## 2022-06-06 DIAGNOSIS — F411 Generalized anxiety disorder: Secondary | ICD-10-CM | POA: Diagnosis not present

## 2022-06-14 ENCOUNTER — Ambulatory Visit: Payer: 59 | Admitting: Internal Medicine

## 2022-06-14 ENCOUNTER — Ambulatory Visit: Payer: 59 | Admitting: Adult Health

## 2022-06-15 DIAGNOSIS — F4312 Post-traumatic stress disorder, chronic: Secondary | ICD-10-CM | POA: Diagnosis not present

## 2022-06-15 DIAGNOSIS — R69 Illness, unspecified: Secondary | ICD-10-CM | POA: Diagnosis not present

## 2022-06-15 DIAGNOSIS — F339 Major depressive disorder, recurrent, unspecified: Secondary | ICD-10-CM | POA: Diagnosis not present

## 2022-06-15 DIAGNOSIS — F909 Attention-deficit hyperactivity disorder, unspecified type: Secondary | ICD-10-CM | POA: Diagnosis not present

## 2022-06-15 DIAGNOSIS — F4000 Agoraphobia, unspecified: Secondary | ICD-10-CM | POA: Diagnosis not present

## 2022-06-16 ENCOUNTER — Telehealth (INDEPENDENT_AMBULATORY_CARE_PROVIDER_SITE_OTHER): Payer: 59 | Admitting: Internal Medicine

## 2022-06-16 DIAGNOSIS — M25561 Pain in right knee: Secondary | ICD-10-CM

## 2022-06-16 DIAGNOSIS — M25512 Pain in left shoulder: Secondary | ICD-10-CM

## 2022-06-16 DIAGNOSIS — M797 Fibromyalgia: Secondary | ICD-10-CM

## 2022-06-16 DIAGNOSIS — W06XXXA Fall from bed, initial encounter: Secondary | ICD-10-CM

## 2022-06-16 MED ORDER — METHOCARBAMOL 500 MG PO TABS: 500.0000 mg | ORAL_TABLET | Freq: Three times a day (TID) | ORAL | 0 refills | Status: DC | PRN

## 2022-06-16 MED ORDER — IBUPROFEN 600 MG PO TABS: 600.0000 mg | ORAL_TABLET | Freq: Three times a day (TID) | ORAL | 0 refills | Status: DC | PRN

## 2022-06-16 NOTE — Progress Notes (Signed)
Virtual Visit via Video Note  I connected with Sara Andrade on 06/16/22 at  4:00 PM EST by a video enabled telemedicine application and verified that I am speaking with the correct person using two identifiers.  Location: Patient: [facility] Provider: Office  Persons participating in this video call: Webb Silversmith, NP and Sara Andrade   I discussed the limitations of evaluation and management by telemedicine and the availability of in person appointments. The patient expressed understanding and agreed to proceed.  History of Present Illness:  Patient reports fatigue, increased muscle and joint pain, mainly in her right knee and left shoulder.  This started 1 week ago after falling out of the bed.  She is having difficulty with range of motion.  She reports numbness, tingling and weakness of her upper and lower extremities.  She has a history of chronic back pain and fibromyalgia which she thinks is currently flared at this time.  She has tried Ibuprofen,, cyclobenzaprine and gabapentin which have not been helpful.  Past Medical History:  Diagnosis Date   Anxiety    Depression    GERD (gastroesophageal reflux disease)    Hypertension     Current Outpatient Medications  Medication Sig Dispense Refill   clonazePAM (KLONOPIN) 0.5 MG tablet Take 0.5 mg by mouth daily as needed.     cyclobenzaprine (FLEXERIL) 5 MG tablet TAKE ONE TABLET BY MOUTH EVERY NIGHT AT BEDTIME AS NEEDED FOR MUSCLE SPASMS 20 tablet 0   fluvoxaMINE (LUVOX) 100 MG tablet Take 100 mg by mouth 2 (two) times daily.     gabapentin (NEURONTIN) 300 MG capsule Take 300 mg by mouth 3 (three) times daily.     Olmesartan-amLODIPine-HCTZ 20-5-12.5 MG TABS Take 1 tablet by mouth daily. 90 tablet 0   omeprazole (PRILOSEC) 40 MG capsule Take 1 capsule (40 mg total) by mouth daily. 90 capsule 1   REXULTI 2 MG TABS tablet Take 2 mg by mouth daily.     Vitamin D, Ergocalciferol, (DRISDOL) 1.25 MG (50000 UNIT) CAPS capsule Take 50,000  Units by mouth once a week.     No current facility-administered medications for this visit.    Allergies  Allergen Reactions   Bee Venom Rash   Escitalopram Other (See Comments)   Lactose Diarrhea    Gas    Prednisone     Other reaction(s): Unknown   Sertraline Diarrhea    Other reaction(s): Other (See Comments), Other (See Comments) Emotional numbness Emotional numbness    Lisinopril Cough and Other (See Comments)    cough cough     Family History  Adopted: Yes    Social History   Socioeconomic History   Marital status: Married    Spouse name: Not on file   Number of children: Not on file   Years of education: Not on file   Highest education level: Not on file  Occupational History   Not on file  Tobacco Use   Smoking status: Former    Packs/day: 1.00    Years: 3.00    Total pack years: 3.00    Types: Cigarettes    Quit date: 2012    Years since quitting: 11.9   Smokeless tobacco: Never  Vaping Use   Vaping Use: Never used  Substance and Sexual Activity   Alcohol use: Yes   Drug use: Never   Sexual activity: Not on file  Other Topics Concern   Not on file  Social History Narrative   Not on file   Social  Determinants of Health   Financial Resource Strain: Not on file  Food Insecurity: Not on file  Transportation Needs: Not on file  Physical Activity: Not on file  Stress: Not on file  Social Connections: Not on file  Intimate Partner Violence: Not on file     Constitutional: Patient reports fatigue.  Denies fever, malaise, headache or abrupt weight changes.  Respiratory: Denies difficulty breathing, shortness of breath, cough or sputum production.   Cardiovascular: Denies chest pain, chest tightness, palpitations or swelling in the hands or feet.  Musculoskeletal: Patient reports chronic back pain, left shoulder, right knee and muscle pain.  Denies difficulty with gait, or joint swelling.  Skin: Denies redness, rashes, lesions or  ulcercations.  Neurological: Patient reports numbness tingling and weakness of her upper and lower extremities..  Denies dizziness, difficulty with memory, difficulty with speech or problems with balance and coordination.    No other specific complaints in a complete review of systems (except as listed in HPI above).    Observations/Objective:   Wt Readings from Last 3 Encounters:  02/24/22 218 lb (98.9 kg)  01/12/22 217 lb (98.4 kg)  11/05/21 207 lb 3.2 oz (94 kg)    General: Appears her stated age, obese, in NAD. Pulmonary/Chest: Normal effort. Musculoskeletal: Normal external rotation of the left shoulder.  Pain with internal rotation of the left shoulder.  Normal extension of the right knee.  Decreased flexion of the right knee. Neurological: Alert and oriented.     Assessment and Plan:  Fibromyalgia, Left Shoulder Pain, Right Knee Pain s/p Fall:  Will D/C cyclobenzaprine and try methocarbamol 500 mg every 8 hours-sedation caution given Rx for ibuprofen 600 mg every 8 hours Encouraged stretching and heat If does not improve, could consider x-ray left shoulder and right knee  RTC in 1 month for follow-up of your chronic conditions  Follow Up Instructions:    I discussed the assessment and treatment plan with the patient. The patient was provided an opportunity to ask questions and all were answered. The patient agreed with the plan and demonstrated an understanding of the instructions.   The patient was advised to call back or seek an in-person evaluation if the symptoms worsen or if the condition fails to improve as anticipated.   Webb Silversmith, NP

## 2022-06-17 ENCOUNTER — Encounter: Payer: Self-pay | Admitting: Internal Medicine

## 2022-06-17 DIAGNOSIS — G4733 Obstructive sleep apnea (adult) (pediatric): Secondary | ICD-10-CM | POA: Diagnosis not present

## 2022-06-17 NOTE — Addendum Note (Signed)
Addended by: Jearld Fenton on: 06/17/2022 01:16 PM   Modules accepted: Level of Service

## 2022-06-17 NOTE — Patient Instructions (Signed)
Myofascial Pain Syndrome and Fibromyalgia ?Myofascial pain syndrome and fibromyalgia are both pain disorders. You may feel this pain mainly in your muscles. ?Myofascial pain syndrome: ?Always has tender points in the muscles that will cause pain when pressed (trigger points). The pain may come and go. ?Usually affects your neck, upper back, and shoulder areas. The pain often moves into your arms and hands. ?Fibromyalgia: ?Has muscle pains and tenderness that come and go. ?Is often associated with tiredness (fatigue) and sleep problems. ?Has trigger points. ?Tends to be long-lasting (chronic), but is not life-threatening. ?Fibromyalgia and myofascial pain syndrome are not the same. However, they often occur together. If you have both conditions, each can make the other worse. Both are common and can cause enough pain and fatigue to make day-to-day activities difficult. Both can be hard to diagnose because their symptoms are common in many other conditions. ?What are the causes? ?The exact causes of these conditions are not known. ?What increases the risk? ?You are more likely to develop either of these conditions if: ?You have a family history of the condition. ?You are female. ?You have certain triggers, such as: ?Spine disorders. ?An injury (trauma) or other physical stressors. ?Being under a lot of stress. ?Medical conditions such as osteoarthritis, rheumatoid arthritis, or lupus. ?What are the signs or symptoms? ?Fibromyalgia ?The main symptom of fibromyalgia is widespread pain and tenderness in your muscles. Pain is sometimes described as stabbing, shooting, or burning. ?You may also have: ?Tingling or numbness. ?Sleep problems and fatigue. ?Problems with attention and concentration (fibro fog). ?Other symptoms may include: ?Bowel and bladder problems. ?Headaches. ?Vision problems. ?Sensitivity to odors and noises. ?Depression or mood changes. ?Painful menstrual periods (dysmenorrhea). ?Dry skin or eyes. ?These  symptoms can vary over time. ?Myofascial pain syndrome ?Symptoms of myofascial pain syndrome include: ?Tight, ropy bands of muscle. ?Uncomfortable sensations in muscle areas. These may include aching, cramping, burning, numbness, tingling, and weakness. ?Difficulty moving certain parts of the body freely (poor range of motion). ?How is this diagnosed? ?This condition may be diagnosed by your symptoms and medical history. You will also have a physical exam. In general: ?Fibromyalgia is diagnosed if you have pain, fatigue, and other symptoms for more than 3 months, and symptoms cannot be explained by another condition. ?Myofascial pain syndrome is diagnosed if you have trigger points in your muscles, and those trigger points are tender and cause pain elsewhere in your body (referred pain). ?How is this treated? ?Treatment for these conditions depends on the type that you have. ?For fibromyalgia a healthy lifestyle is the most important treatment including aerobic and strength exercises. Different types of medicines are used to help treat pain and include: ?NSAIDs. ?Medicines for treating depression. ?Medicines that help control seizures. ?Medicines that relax the muscles. ?Treatment for myofascial pain syndrome includes: ?Pain medicines, such as NSAIDs. ?Cooling and stretching of muscles. ?Massage therapy with myofascial release technique. ?Trigger point injections. ?Treating these conditions often requires a team of health care providers. These may include: ?Your primary care provider. ?A physical therapist. ?Complementary health care providers, such as massage therapists or acupuncturists. ?A psychiatrist for cognitive behavioral therapy. ?Follow these instructions at home: ?Medicines ?Take over-the-counter and prescription medicines only as told by your health care provider. ?Ask your health care provider if the medicine prescribed to you: ?Requires you to avoid driving or using machinery. ?Can cause constipation.  You may need to take these actions to prevent or treat constipation: ?Drink enough fluid to keep your urine pale   yellow. ?Take over-the-counter or prescription medicines. ?Eat foods that are high in fiber, such as beans, whole grains, and fresh fruits and vegetables. ?Limit foods that are high in fat and processed sugars, such as fried or sweet foods. ?Lifestyle ? ?Do exercises as told by your health care provider or physical therapist. ?Practice relaxation techniques to control your stress. You may want to try: ?Biofeedback. ?Visual imagery. ?Hypnosis. ?Muscle relaxation. ?Yoga. ?Meditation. ?Maintain a healthy lifestyle. This includes eating a healthy diet and getting enough sleep. ?Do not use any products that contain nicotine or tobacco. These products include cigarettes, chewing tobacco, and vaping devices, such as e-cigarettes. If you need help quitting, ask your health care provider. ?General instructions ?Talk to your health care provider about complementary treatments, such as acupuncture or massage. ?Do not do activities that stress or strain your muscles. This includes repetitive motions and heavy lifting. ?Keep all follow-up visits. This is important. ?Where to find support ?Consider joining a support group with others who are diagnosed with this condition. ?National Fibromyalgia Association: www.fmaware.org ?Where to find more information ?American Chronic Pain Association: www.theacpa.org ?Contact a health care provider if: ?You have new symptoms. ?Your symptoms get worse or your pain is severe. ?You have side effects from your medicines. ?You have trouble sleeping. ?Your condition is causing depression or anxiety. ?Get help right away if: ?You have thoughts of hurting yourself or others. ?Get help right awayif you feel like you may hurt yourself or others, or have thoughts about taking your own life. Go to your nearest emergency room or: ?Call 911. ?Call the National Suicide Prevention Lifeline at  1-800-273-8255 or 988. This is open 24 hours a day. ?Text the Crisis Text Line at 741741. ?Summary ?Myofascial pain syndrome and fibromyalgia are pain disorders. ?Myofascial pain syndrome has tender points in the muscles that will cause pain when pressed (trigger points). Fibromyalgia also has muscle pains and tenderness that come and go, but this condition is often associated with fatigue and sleep disturbances. ?Fibromyalgia and myofascial pain syndrome are not the same but often occur together, causing pain and fatigue that make day-to-day activities difficult. ?Follow your health care provider's instructions for taking medicines and maintaining a healthy lifestyle. ?This information is not intended to replace advice given to you by your health care provider. Make sure you discuss any questions you have with your health care provider. ?Document Revised: 06/04/2021 Document Reviewed: 06/04/2021 ?Elsevier Patient Education ? 2023 Elsevier Inc. ? ?

## 2022-06-20 ENCOUNTER — Other Ambulatory Visit: Payer: Self-pay | Admitting: Family Medicine

## 2022-06-20 DIAGNOSIS — M5412 Radiculopathy, cervical region: Secondary | ICD-10-CM

## 2022-06-22 DIAGNOSIS — F339 Major depressive disorder, recurrent, unspecified: Secondary | ICD-10-CM | POA: Diagnosis not present

## 2022-06-22 DIAGNOSIS — F909 Attention-deficit hyperactivity disorder, unspecified type: Secondary | ICD-10-CM | POA: Diagnosis not present

## 2022-06-22 DIAGNOSIS — F4312 Post-traumatic stress disorder, chronic: Secondary | ICD-10-CM | POA: Diagnosis not present

## 2022-06-22 DIAGNOSIS — F4000 Agoraphobia, unspecified: Secondary | ICD-10-CM | POA: Diagnosis not present

## 2022-06-22 DIAGNOSIS — R69 Illness, unspecified: Secondary | ICD-10-CM | POA: Diagnosis not present

## 2022-06-24 ENCOUNTER — Ambulatory Visit: Payer: 59 | Admitting: Internal Medicine

## 2022-06-29 DIAGNOSIS — R69 Illness, unspecified: Secondary | ICD-10-CM | POA: Diagnosis not present

## 2022-06-29 DIAGNOSIS — F4000 Agoraphobia, unspecified: Secondary | ICD-10-CM | POA: Diagnosis not present

## 2022-06-29 DIAGNOSIS — F4312 Post-traumatic stress disorder, chronic: Secondary | ICD-10-CM | POA: Diagnosis not present

## 2022-06-29 DIAGNOSIS — F909 Attention-deficit hyperactivity disorder, unspecified type: Secondary | ICD-10-CM | POA: Diagnosis not present

## 2022-06-29 DIAGNOSIS — F339 Major depressive disorder, recurrent, unspecified: Secondary | ICD-10-CM | POA: Diagnosis not present

## 2022-07-06 DIAGNOSIS — F4312 Post-traumatic stress disorder, chronic: Secondary | ICD-10-CM | POA: Diagnosis not present

## 2022-07-06 DIAGNOSIS — F4000 Agoraphobia, unspecified: Secondary | ICD-10-CM | POA: Diagnosis not present

## 2022-07-06 DIAGNOSIS — R69 Illness, unspecified: Secondary | ICD-10-CM | POA: Diagnosis not present

## 2022-07-06 DIAGNOSIS — F339 Major depressive disorder, recurrent, unspecified: Secondary | ICD-10-CM | POA: Diagnosis not present

## 2022-07-06 DIAGNOSIS — F909 Attention-deficit hyperactivity disorder, unspecified type: Secondary | ICD-10-CM | POA: Diagnosis not present

## 2022-07-12 ENCOUNTER — Ambulatory Visit (INDEPENDENT_AMBULATORY_CARE_PROVIDER_SITE_OTHER): Payer: 59 | Admitting: Internal Medicine

## 2022-07-12 ENCOUNTER — Encounter: Payer: Self-pay | Admitting: Internal Medicine

## 2022-07-12 VITALS — BP 138/77 | HR 83 | Ht 62.0 in | Wt 220.0 lb

## 2022-07-12 DIAGNOSIS — K219 Gastro-esophageal reflux disease without esophagitis: Secondary | ICD-10-CM

## 2022-07-12 DIAGNOSIS — F32A Depression, unspecified: Secondary | ICD-10-CM

## 2022-07-12 DIAGNOSIS — I10 Essential (primary) hypertension: Secondary | ICD-10-CM | POA: Diagnosis not present

## 2022-07-12 DIAGNOSIS — M797 Fibromyalgia: Secondary | ICD-10-CM

## 2022-07-12 DIAGNOSIS — Z114 Encounter for screening for human immunodeficiency virus [HIV]: Secondary | ICD-10-CM | POA: Diagnosis not present

## 2022-07-12 DIAGNOSIS — Z1159 Encounter for screening for other viral diseases: Secondary | ICD-10-CM

## 2022-07-12 DIAGNOSIS — G8929 Other chronic pain: Secondary | ICD-10-CM

## 2022-07-12 DIAGNOSIS — R7303 Prediabetes: Secondary | ICD-10-CM | POA: Diagnosis not present

## 2022-07-12 DIAGNOSIS — M545 Low back pain, unspecified: Secondary | ICD-10-CM | POA: Diagnosis not present

## 2022-07-12 DIAGNOSIS — R5383 Other fatigue: Secondary | ICD-10-CM

## 2022-07-12 DIAGNOSIS — Z6841 Body Mass Index (BMI) 40.0 and over, adult: Secondary | ICD-10-CM

## 2022-07-12 DIAGNOSIS — M546 Pain in thoracic spine: Secondary | ICD-10-CM

## 2022-07-12 DIAGNOSIS — E079 Disorder of thyroid, unspecified: Secondary | ICD-10-CM

## 2022-07-12 DIAGNOSIS — F419 Anxiety disorder, unspecified: Secondary | ICD-10-CM

## 2022-07-12 DIAGNOSIS — E782 Mixed hyperlipidemia: Secondary | ICD-10-CM

## 2022-07-12 DIAGNOSIS — R69 Illness, unspecified: Secondary | ICD-10-CM | POA: Diagnosis not present

## 2022-07-12 DIAGNOSIS — F4310 Post-traumatic stress disorder, unspecified: Secondary | ICD-10-CM

## 2022-07-12 DIAGNOSIS — F900 Attention-deficit hyperactivity disorder, predominantly inattentive type: Secondary | ICD-10-CM

## 2022-07-12 NOTE — Assessment & Plan Note (Addendum)
Stable on lamictal, diazepam, fluvoxamine, Rexulti, clonazepam and prazosin She will continue to follow with psychiatry

## 2022-07-12 NOTE — Patient Instructions (Signed)

## 2022-07-12 NOTE — Assessment & Plan Note (Addendum)
Continue prazosin, diazapem, lamictal, fluvoxamine, Rexulti and clonazepam She will continue to follow with psychiatry

## 2022-07-12 NOTE — Assessment & Plan Note (Signed)
Encouraged regular stretching and core strengthening Continue gabapentin and methocarbamol She will continue to follow with physiatry

## 2022-07-12 NOTE — Progress Notes (Signed)
Subjective:    Patient ID: Sara Andrade, female    DOB: June 22, 1981, 41 y.o.   MRN: [id number]  HPI  Patient presents to clinic today for follow-up of chronic conditions.  HTN: Her BP today is 138/77.  She is not taking Amlodipine-Olmesartan-HCTZ as prescribed because she felt like it caused chest tightness. Her blood pressure at home runs 130's/90's. There is no ECG on file.  ADHD: She is not currently taking any medication for this. She follows with psychiatry.  Anxiety, Depression and PTSD: Chronic, managed on Diazepam, Lamictal, Clonazepam, Prazosin, Fluvoxamine and Rexulti.  She is currently seeing a therapist and a psychiatrist.  She denies SI/HI.  Fibromyalgia: She reports chronic joint and muscle pain, worse in her spine.  She is having worsening paresthesias in her upper extremities.  She does have paresthesias in her lower extremities.  Her wife reports she needs help at times getting dressed, buttoning her pants.  She takes Gabapentin and Methocarbamol with some relief of symptoms. She is following with phsyiatry.  GERD: Triggered by spicy and greasy food.  She denies breakthrough on Omeprazole.  There is no upper GI on file.  Prediabetes: Her last A1c was 5.5%, 2019.  She is not taking any oral diabetic medication at this time.  She does not check her sugars.  HLD: Her last LDL was 167, triglycerides 131, 10/2019.  She is not taking any cholesterol-lowering medication at this time.  She does not consume a low-fat diet.  Review of Systems     Past Medical History:  Diagnosis Date   Anxiety    Depression    GERD (gastroesophageal reflux disease)    Hypertension     Current Outpatient Medications  Medication Sig Dispense Refill   clonazePAM (KLONOPIN) 0.5 MG tablet Take 0.5 mg by mouth daily as needed.     gabapentin (NEURONTIN) 300 MG capsule Take 300 mg by mouth 3 (three) times daily.     ibuprofen (ADVIL) 600 MG tablet Take 1 tablet (600 mg total) by mouth every 8  (eight) hours as needed. 30 tablet 0   methocarbamol (ROBAXIN) 500 MG tablet Take 1 tablet (500 mg total) by mouth every 8 (eight) hours as needed for muscle spasms. 90 tablet 0   Olmesartan-amLODIPine-HCTZ 20-5-12.5 MG TABS Take 1 tablet by mouth daily. 90 tablet 0   omeprazole (PRILOSEC) 40 MG capsule Take 1 capsule (40 mg total) by mouth daily. 90 capsule 1   REXULTI 2 MG TABS tablet Take 2 mg by mouth daily.     Vitamin D, Ergocalciferol, (DRISDOL) 1.25 MG (50000 UNIT) CAPS capsule Take 50,000 Units by mouth once a week.     No current facility-administered medications for this visit.    Allergies  Allergen Reactions   Bee Venom Rash   Escitalopram Other (See Comments)   Lactose Diarrhea    Gas    Prednisone     Other reaction(s): Unknown   Sertraline Diarrhea    Other reaction(s): Other (See Comments), Other (See Comments) Emotional numbness Emotional numbness    Lisinopril Cough and Other (See Comments)    cough cough     Family History  Adopted: Yes    Social History   Socioeconomic History   Marital status: Married    Spouse name: Not on file   Number of children: Not on file   Years of education: Not on file   Highest education level: Not on file  Occupational History   Not on file  Tobacco  Use   Smoking status: Former    Packs/day: 1.00    Years: 3.00    Total pack years: 3.00    Types: Cigarettes    Quit date: 2012    Years since quitting: 11.9   Smokeless tobacco: Never  Vaping Use   Vaping Use: Never used  Substance and Sexual Activity   Alcohol use: Yes   Drug use: Never   Sexual activity: Not on file  Other Topics Concern   Not on file  Social History Narrative   Not on file   Social Determinants of Health   Financial Resource Strain: Not on file  Food Insecurity: Not on file  Transportation Needs: Not on file  Physical Activity: Not on file  Stress: Not on file  Social Connections: Not on file  Intimate Partner Violence: Not on  file     Constitutional: Pt reports fatigue. Denies fever, malaise, headache or abrupt weight changes.  HEENT: Denies eye pain, eye redness, ear pain, ringing in the ears, wax buildup, runny nose, nasal congestion, bloody nose, or sore throat. Respiratory: Denies difficulty breathing, shortness of breath, cough or sputum production.   Cardiovascular: Denies chest pain, chest tightness, palpitations or swelling in the hands or feet.  Gastrointestinal:  Denies abdominal pain, bloating, constipation, diarrhea or blood in the stool.  GU: Denies urgency, frequency, pain with urination, burning sensation, blood in urine, odor or discharge. Musculoskeletal: Patient reports chronic joint muscle pain.  Denies decrease in range of motion, difficulty with gait, muscle or joint  swelling.  Skin: Denies redness, rashes, lesions or ulcercations.  Neurological: Patient reports paresthesia of upper and lower extremities, difficulty with coordination.  Denies dizziness, difficulty with memory, difficulty with speech or problems with balance.  Psych: Patient has a history of anxiety and depression.  Denies SI/HI.  No other specific complaints in a complete review of systems (except as listed in HPI above).  Objective:   Physical Exam  BP 138/77   Pulse 83   Ht '5\' 2"'$  (1.575 m)   Wt 220 lb (99.8 kg)   SpO2 98%   BMI 40.24 kg/m   Wt Readings from Last 3 Encounters:  02/24/22 218 lb (98.9 kg)  01/12/22 217 lb (98.4 kg)  11/05/21 207 lb 3.2 oz (94 kg)    General: Appears her stated age, obese, in NAD. Skin: Warm, dry and intact.  HEENT: Head: normal shape and size; Eyes: sclera white, no icterus, conjunctiva pink, PERRLA and EOMs intact;  Neck:  Neck supple, trachea midline. No masses, lumps or thyromegaly present but thyroid tender with palpation.  Cardiovascular: Normal rate and rhythm. S1,S2 noted.  No murmur, rubs or gallops noted. No JVD or BLE edema.  Pulmonary/Chest: Normal effort and positive  vesicular breath sounds. No respiratory distress. No wheezes, rales or ronchi noted.  Abdomen: Normal bowel sounds.  Musculoskeletal: She has no difficulty getting on the exam table.  Strength 4/5 LUE/LLE.  Strength 5/5 RUE, RLE.  No signs of joint swelling. No difficulty with gait.  Neurological: Alert and oriented. Cranial nerves II-XII grossly intact. Coordination normal.  Psychiatric: Mood and affect normal. Behavior is normal. Judgment and thought content normal.         Assessment & Plan:     RTC in 6 months for your annual exam Webb Silversmith, NP

## 2022-07-12 NOTE — Assessment & Plan Note (Signed)
Encouraged diet and exercise for weight loss ?

## 2022-07-12 NOTE — Assessment & Plan Note (Signed)
Controlled off meds Reinforced DASH diet and exercise for weight loss We will continue to monitor

## 2022-07-12 NOTE — Assessment & Plan Note (Addendum)
Encouraged her to avoid foods that trigger reflux Encouraged weight loss as this can help reduce reflux symptoms Continue omeprazole

## 2022-07-12 NOTE — Assessment & Plan Note (Signed)
C-Met and lipid profile today Encourage her to consume a low-fat diet

## 2022-07-12 NOTE — Assessment & Plan Note (Signed)
Currently not taking any medication for this Follows with psychiatry

## 2022-07-12 NOTE — Assessment & Plan Note (Signed)
A1c today Encourage low-carb diet and exercise for weight loss 

## 2022-07-13 ENCOUNTER — Ambulatory Visit
Admission: RE | Admit: 2022-07-13 | Discharge: 2022-07-13 | Disposition: A | Discharge status: Home / Self Care | Payer: 59 | Source: Ambulatory Visit | Attending: Family Medicine | Admitting: Family Medicine

## 2022-07-13 ENCOUNTER — Encounter: Payer: Self-pay | Admitting: Internal Medicine

## 2022-07-13 DIAGNOSIS — M1611 Unilateral primary osteoarthritis, right hip: Secondary | ICD-10-CM | POA: Diagnosis not present

## 2022-07-13 DIAGNOSIS — E782 Mixed hyperlipidemia: Secondary | ICD-10-CM

## 2022-07-13 DIAGNOSIS — M542 Cervicalgia: Secondary | ICD-10-CM | POA: Diagnosis not present

## 2022-07-13 DIAGNOSIS — M4802 Spinal stenosis, cervical region: Secondary | ICD-10-CM | POA: Diagnosis not present

## 2022-07-13 DIAGNOSIS — R200 Anesthesia of skin: Secondary | ICD-10-CM | POA: Diagnosis not present

## 2022-07-13 DIAGNOSIS — R202 Paresthesia of skin: Secondary | ICD-10-CM | POA: Diagnosis not present

## 2022-07-13 DIAGNOSIS — M5412 Radiculopathy, cervical region: Secondary | ICD-10-CM

## 2022-07-13 LAB — HEPATITIS C ANTIBODY: Hepatitis C Ab: NONREACTIVE

## 2022-07-13 LAB — COMPLETE METABOLIC PANEL WITH GFR
AG Ratio: 1.7 (calc) (ref 1.0–2.5)
ALT: 17 U/L (ref 6–29)
AST: 12 U/L (ref 10–30)
Albumin: 4.3 g/dL (ref 3.6–5.1)
Alkaline phosphatase (APISO): 77 U/L (ref 31–125)
BUN/Creatinine Ratio: 18 (calc) (ref 6–22)
BUN: 19 mg/dL (ref 7–25)
CO2: 27 mmol/L (ref 20–32)
Calcium: 9.7 mg/dL (ref 8.6–10.2)
Chloride: 103 mmol/L (ref 98–110)
Creat: 1.07 mg/dL — ABNORMAL HIGH (ref 0.50–0.99)
Globulin: 2.6 g/dL (calc) (ref 1.9–3.7)
Glucose, Bld: 106 mg/dL — ABNORMAL HIGH (ref 65–99)
Potassium: 5.1 mmol/L (ref 3.5–5.3)
Sodium: 139 mmol/L (ref 135–146)
Total Bilirubin: 0.2 mg/dL (ref 0.2–1.2)
Total Protein: 6.9 g/dL (ref 6.1–8.1)
eGFR: 66 mL/min/{1.73_m2} (ref >=60)

## 2022-07-13 LAB — LIPID PANEL
Cholesterol: 244 mg/dL — ABNORMAL HIGH (ref <200)
HDL: 82 mg/dL (ref >=50)
LDL Cholesterol (Calc): 134 mg/dL (calc) — ABNORMAL HIGH
Non-HDL Cholesterol (Calc): 162 mg/dL (calc) — ABNORMAL HIGH (ref <130)
Total CHOL/HDL Ratio: 3 (calc) (ref <5.0)
Triglycerides: 151 mg/dL — ABNORMAL HIGH (ref <150)

## 2022-07-13 LAB — CBC
HCT: 41.4 % (ref 35.0–45.0)
Hemoglobin: 13.8 g/dL (ref 11.7–15.5)
MCH: 28.5 pg (ref 27.0–33.0)
MCHC: 33.3 g/dL (ref 32.0–36.0)
MCV: 85.4 fL (ref 80.0–100.0)
MPV: 11.1 fL (ref 7.5–12.5)
Platelets: 264 10*3/uL (ref 140–400)
RBC: 4.85 10*6/uL (ref 3.80–5.10)
RDW: 12.9 % (ref 11.0–15.0)
WBC: 7.1 10*3/uL (ref 3.8–10.8)

## 2022-07-13 LAB — HEMOGLOBIN A1C
Hgb A1c MFr Bld: 5.8 % of total Hgb — ABNORMAL HIGH (ref <5.7)
Mean Plasma Glucose: 120 mg/dL
eAG (mmol/L): 6.6 mmol/L

## 2022-07-13 LAB — TSH: TSH: 1.62 mIU/L

## 2022-07-13 LAB — HIV ANTIBODY (ROUTINE TESTING W REFLEX): HIV 1&2 Ab, 4th Generation: NONREACTIVE

## 2022-07-14 MED ORDER — LOVASTATIN 10 MG PO TABS: 10.0000 mg | ORAL_TABLET | Freq: Every day | ORAL | 1 refills | Status: DC

## 2022-07-18 DIAGNOSIS — G4733 Obstructive sleep apnea (adult) (pediatric): Secondary | ICD-10-CM | POA: Diagnosis not present

## 2022-07-19 DIAGNOSIS — R69 Illness, unspecified: Secondary | ICD-10-CM | POA: Diagnosis not present

## 2022-07-19 DIAGNOSIS — F4310 Post-traumatic stress disorder, unspecified: Secondary | ICD-10-CM | POA: Diagnosis not present

## 2022-07-19 DIAGNOSIS — F411 Generalized anxiety disorder: Secondary | ICD-10-CM | POA: Diagnosis not present

## 2022-07-19 DIAGNOSIS — F909 Attention-deficit hyperactivity disorder, unspecified type: Secondary | ICD-10-CM | POA: Diagnosis not present

## 2022-07-20 DIAGNOSIS — F4312 Post-traumatic stress disorder, chronic: Secondary | ICD-10-CM | POA: Diagnosis not present

## 2022-07-20 DIAGNOSIS — F4000 Agoraphobia, unspecified: Secondary | ICD-10-CM | POA: Diagnosis not present

## 2022-07-20 DIAGNOSIS — F339 Major depressive disorder, recurrent, unspecified: Secondary | ICD-10-CM | POA: Diagnosis not present

## 2022-07-20 DIAGNOSIS — F909 Attention-deficit hyperactivity disorder, unspecified type: Secondary | ICD-10-CM | POA: Diagnosis not present

## 2022-07-20 DIAGNOSIS — R69 Illness, unspecified: Secondary | ICD-10-CM | POA: Diagnosis not present

## 2022-07-21 ENCOUNTER — Other Ambulatory Visit: Payer: Self-pay | Admitting: Internal Medicine

## 2022-07-21 NOTE — Telephone Encounter (Signed)
Dc'd 07/12/22 R. Baity NP  Requested Prescriptions  Refused Prescriptions Disp Refills   Olmesartan-amLODIPine-HCTZ 20-5-12.5 MG TABS [Pharmacy Med Name: OLMSRTN-AMLDPN-HCTZ 20-5-12.5] 30 tablet 2    Sig: TAKE 1 TABLET BY MOUTH EVERY DAY     Cardiovascular: CCB + ARB + Diuretic Combos Failed - 07/21/2022  1:31 AM      Failed - Cr in normal range and within 180 days    Creat  Date Value Ref Range Status  07/12/2022 1.07 (H) 0.50 - 0.99 mg/dL Final         Passed - K in normal range and within 180 days    Potassium  Date Value Ref Range Status  07/12/2022 5.1 3.5 - 5.3 mmol/L Final         Passed - Na in normal range and within 180 days    Sodium  Date Value Ref Range Status  07/12/2022 139 135 - 146 mmol/L Final         Passed - eGFR is 10 or above and within 180 days    eGFR  Date Value Ref Range Status  07/12/2022 66 > OR = 60 mL/min/1.30m Final         Passed - Patient is not pregnant      Passed - Last BP in normal range    BP Readings from Last 1 Encounters:  07/12/22 138/77         Passed - Last Heart Rate in normal range    Pulse Readings from Last 1 Encounters:  07/12/22 83         Passed - Valid encounter within last 6 months    Recent Outpatient Visits           1 week ago Primary hypertension   [place] [facility]   [facility]   4 months ago Primary hypertension   [place] [place]   6 months ago Encounter for general adult medical examination with abnormal findings   [facility]   1 year ago Chronic fatigue   [facility]       Future Appointments             In 4 months SBrendolyn Patty MD [place]

## 2022-08-18 DIAGNOSIS — G4733 Obstructive sleep apnea (adult) (pediatric): Secondary | ICD-10-CM | POA: Diagnosis not present

## 2022-08-23 DIAGNOSIS — R69 Illness, unspecified: Secondary | ICD-10-CM | POA: Diagnosis not present

## 2022-08-23 DIAGNOSIS — F4310 Post-traumatic stress disorder, unspecified: Secondary | ICD-10-CM | POA: Diagnosis not present

## 2022-08-23 DIAGNOSIS — F411 Generalized anxiety disorder: Secondary | ICD-10-CM | POA: Diagnosis not present

## 2022-08-23 DIAGNOSIS — F909 Attention-deficit hyperactivity disorder, unspecified type: Secondary | ICD-10-CM | POA: Diagnosis not present

## 2022-08-30 ENCOUNTER — Other Ambulatory Visit: Payer: Self-pay | Admitting: Internal Medicine

## 2022-08-31 NOTE — Telephone Encounter (Signed)
Requested medication (s) are due for refill today: yes  Requested medication (s) are on the active medication list: yes  Last refill:  06/16/22  Future visit scheduled: yes  Notes to clinic:  Unable to refill per protocol, cannot delegate.      Requested Prescriptions  Pending Prescriptions Disp Refills   methocarbamol (ROBAXIN) 500 MG tablet [Pharmacy Med Name: METHOCARBAMOL 500 MG TABLET] 90 tablet 0    Sig: TAKE 1 TABLET BY MOUTH EVERY 8 HOURS AS NEEDED FOR MUSCLE SPASMS.     Not Delegated - Analgesics:  Muscle Relaxants Failed - 08/30/2022  7:44 PM      Failed - This refill cannot be delegated      Passed - Valid encounter within last 6 months    Recent Outpatient Visits           1 month ago Primary hypertension   [facility] [place], Coralie Keens, NP   2 months ago [facility] [place], Coralie Keens, NP   6 months ago Primary hypertension   [facility] [place], Coralie Keens, NP   7 months ago Encounter for general adult medical examination with abnormal findings   [facility] [place], Coralie Keens, NP   1 year ago Chronic fatigue   [facility] [place], Coralie Keens, NP       Future Appointments             In 2 months Brendolyn Patty, MD [place]            Signed Prescriptions Disp Refills   ibuprofen (ADVIL) 600 MG tablet 90 tablet 0    Sig: TAKE 1 TABLET BY MOUTH EVERY 8 HOURS AS NEEDED.     Analgesics:  NSAIDS Failed - 08/30/2022  7:44 PM      Failed - Manual Review: Labs are only required if the patient has taken medication for more than 8 weeks.      Failed - Cr in normal range and within 360 days    Creat  Date Value Ref Range Status  07/12/2022 1.07 (H) 0.50 - 0.99 mg/dL Final         Passed - HGB in normal range and within 360 days    Hemoglobin  Date Value Ref Range Status  07/12/2022 13.8 11.7  - 15.5 g/dL Final         Passed - PLT in normal range and within 360 days    Platelets  Date Value Ref Range Status  07/12/2022 264 140 - 400 Thousand/uL Final         Passed - HCT in normal range and within 360 days    HCT  Date Value Ref Range Status  07/12/2022 41.4 35.0 - 45.0 % Final         Passed - eGFR is 30 or above and within 360 days    eGFR  Date Value Ref Range Status  07/12/2022 66 > OR = 60 mL/min/1.50m Final         Passed - Patient is not pregnant      Passed - Valid encounter within last 12 months    Recent Outpatient Visits           1 month ago [place]   [facility] [facility]   2 months ago Fibromyalgia  [facility] [place], Coralie Keens, NP   6 months ago Primary hypertension   [facility] [place], Coralie Keens, NP   7 months ago Encounter for general adult medical examination with abnormal findings   [facility] [place]   1 year ago Chronic fatigue   [facility], Coralie Keens, NP       Future Appointments             In 2 months Brendolyn Patty, MD [place]

## 2022-08-31 NOTE — Telephone Encounter (Signed)
Requested Prescriptions  Pending Prescriptions Disp Refills   ibuprofen (ADVIL) 600 MG tablet [Pharmacy Med Name: IBUPROFEN 600 MG TABLET] 90 tablet 0    Sig: TAKE 1 TABLET BY MOUTH EVERY 8 HOURS AS NEEDED.     Analgesics:  NSAIDS Failed - 08/30/2022  7:44 PM      Failed - Manual Review: Labs are only required if the patient has taken medication for more than 8 weeks.      Failed - Cr in normal range and within 360 days    Creat  Date Value Ref Range Status  07/12/2022 1.07 (H) 0.50 - 0.99 mg/dL Final         Passed - HGB in normal range and within 360 days    Hemoglobin  Date Value Ref Range Status  07/12/2022 13.8 11.7 - 15.5 g/dL Final         Passed - PLT in normal range and within 360 days    Platelets  Date Value Ref Range Status  07/12/2022 264 140 - 400 Thousand/uL Final         Passed - HCT in normal range and within 360 days    HCT  Date Value Ref Range Status  07/12/2022 41.4 35.0 - 45.0 % Final         Passed - eGFR is 30 or above and within 360 days    eGFR  Date Value Ref Range Status  07/12/2022 66 > OR = 60 mL/min/1.31m Final         Passed - Patient is not pregnant      Passed - Valid encounter within last 12 months    Recent Outpatient Visits           1 month ago [facility]   [facility]   [facility]   6 months ago Primary hypertension   [facility]   7 months ago Encounter for general adult medical examination with abnormal findings   [facility]   1 year ago Chronic fatigue   [facility]       Future Appointments             In 2 months SBrendolyn Patty MD [place]            methocarbamol (ROBAXIN) 500 MG tablet [Pharmacy Med Name:  METHOCARBAMOL 500 MG TABLET] 90 tablet 0    Sig: TAKE 1 TABLET BY MOUTH EVERY 8 HOURS AS NEEDED FOR MUSCLE SPASMS.     Not Delegated - Analgesics:  Muscle Relaxants Failed - 08/30/2022  7:44 PM      Failed - This refill cannot be delegated      Passed - Valid encounter within last 6 months    Recent Outpatient Visits           1 month ago [facility]   [facility]   [facility]   6 months ago Primary hypertension   [facility]   7 months ago Encounter for general adult medical examination with abnormal findings  [facility] [place], Coralie Keens, NP   1 year ago Chronic fatigue   [facility] [place], Coralie Keens, NP       Future Appointments             In 2 months Brendolyn Patty, MD [place]

## 2022-09-13 DIAGNOSIS — F4310 Post-traumatic stress disorder, unspecified: Secondary | ICD-10-CM | POA: Diagnosis not present

## 2022-09-16 DIAGNOSIS — G4733 Obstructive sleep apnea (adult) (pediatric): Secondary | ICD-10-CM | POA: Diagnosis not present

## 2022-09-20 DIAGNOSIS — F4310 Post-traumatic stress disorder, unspecified: Secondary | ICD-10-CM | POA: Diagnosis not present

## 2022-09-27 DIAGNOSIS — F4310 Post-traumatic stress disorder, unspecified: Secondary | ICD-10-CM | POA: Diagnosis not present

## 2022-10-01 ENCOUNTER — Other Ambulatory Visit: Payer: Self-pay | Admitting: Internal Medicine

## 2022-10-03 NOTE — Telephone Encounter (Signed)
Requested Prescriptions  Pending Prescriptions Disp Refills   ibuprofen (ADVIL) 600 MG tablet [Pharmacy Med Name: IBUPROFEN 600 MG TABLET] 90 tablet 0    Sig: TAKE 1 TABLET BY MOUTH EVERY 8 HOURS AS NEEDED     Analgesics:  NSAIDS Failed - 10/01/2022  8:38 AM      Failed - Manual Review: Labs are only required if the patient has taken medication for more than 8 weeks.      Failed - Cr in normal range and within 360 days    Creat  Date Value Ref Range Status  07/12/2022 1.07 (H) 0.50 - 0.99 mg/dL Final         Passed - HGB in normal range and within 360 days    Hemoglobin  Date Value Ref Range Status  07/12/2022 13.8 11.7 - 15.5 g/dL Final         Passed - PLT in normal range and within 360 days    Platelets  Date Value Ref Range Status  07/12/2022 264 140 - 400 Thousand/uL Final         Passed - HCT in normal range and within 360 days    HCT  Date Value Ref Range Status  07/12/2022 41.4 35.0 - 45.0 % Final         Passed - eGFR is 30 or above and within 360 days    eGFR  Date Value Ref Range Status  07/12/2022 66 > OR = 60 mL/min/1.55m2 Final         Passed - Patient is not pregnant      Passed - Valid encounter within last 12 months    Recent Outpatient Visits           2 months ago Primary hypertension   [facility] [place], Coralie Keens, NP   3 months ago [facility] [place], Coralie Keens, NP   7 months ago Primary hypertension   [facility] [place], Coralie Keens, NP   8 months ago Encounter for general adult medical examination with abnormal findings   [facility] [place], Coralie Keens, NP   1 year ago Chronic fatigue   [facility] [place], Coralie Keens, NP       Future Appointments             In 1 month Brendolyn Patty, MD [place]

## 2022-10-04 DIAGNOSIS — F4310 Post-traumatic stress disorder, unspecified: Secondary | ICD-10-CM | POA: Diagnosis not present

## 2022-10-04 DIAGNOSIS — F411 Generalized anxiety disorder: Secondary | ICD-10-CM | POA: Diagnosis not present

## 2022-10-04 DIAGNOSIS — F329 Major depressive disorder, single episode, unspecified: Secondary | ICD-10-CM | POA: Diagnosis not present

## 2022-10-04 DIAGNOSIS — F909 Attention-deficit hyperactivity disorder, unspecified type: Secondary | ICD-10-CM | POA: Diagnosis not present

## 2022-10-06 DIAGNOSIS — F4310 Post-traumatic stress disorder, unspecified: Secondary | ICD-10-CM | POA: Diagnosis not present

## 2022-10-11 DIAGNOSIS — F4310 Post-traumatic stress disorder, unspecified: Secondary | ICD-10-CM | POA: Diagnosis not present

## 2022-10-17 DIAGNOSIS — G4733 Obstructive sleep apnea (adult) (pediatric): Secondary | ICD-10-CM | POA: Diagnosis not present

## 2022-10-18 DIAGNOSIS — F4310 Post-traumatic stress disorder, unspecified: Secondary | ICD-10-CM | POA: Diagnosis not present

## 2022-10-24 ENCOUNTER — Other Ambulatory Visit: Payer: Self-pay | Admitting: Internal Medicine

## 2022-10-25 DIAGNOSIS — F4310 Post-traumatic stress disorder, unspecified: Secondary | ICD-10-CM | POA: Diagnosis not present

## 2022-10-25 NOTE — Telephone Encounter (Signed)
Requested medication (s) are due for refill today -yes  Requested medication (s) are on the active medication list -yes  Future visit scheduled -no  Last refill: 09/01/22 #90  Notes to clinic: non delegated Rx  Requested Prescriptions  Pending Prescriptions Disp Refills   methocarbamol (ROBAXIN) 500 MG tablet [Pharmacy Med Name: METHOCARBAMOL 500 MG TABLET] 90 tablet 0    Sig: TAKE 1 TABLET BY MOUTH EVERY 8 HOURS AS NEEDED FOR MUSCLE SPASM     Not Delegated - Analgesics:  Muscle Relaxants Failed - 10/24/2022  1:01 PM      Failed - This refill cannot be delegated      Passed - Valid encounter within last 6 months    Recent Outpatient Visits           3 months ago Primary hypertension   [facility] [place], Salvadore Oxford, NP   4 months ago Fibromyalgia   [place] [facility] [place], Salvadore Oxford, NP   8 months ago Primary hypertension   [facility] [place], Salvadore Oxford, NP   9 months ago Encounter for general adult medical examination with abnormal findings   [place] [facility] [place], Salvadore Oxford, NP   1 year ago Chronic fatigue   [place] [place], Salvadore Oxford, NP       Future Appointments             In 3 weeks Willeen Niece, MD [facility]               Requested Prescriptions  Pending Prescriptions Disp Refills   methocarbamol (ROBAXIN) 500 MG tablet [Pharmacy Med Name: METHOCARBAMOL 500 MG TABLET] 90 tablet 0    Sig: TAKE 1 TABLET BY MOUTH EVERY 8 HOURS AS NEEDED FOR MUSCLE SPASM     Not Delegated - Analgesics:  Muscle Relaxants Failed - 10/24/2022  1:01 PM      Failed - This refill cannot be delegated      Passed - Valid encounter within last 6 months    Recent Outpatient Visits           3 months ago Primary hypertension   [facility] [place], Salvadore Oxford, NP   4 months ago Fibromyalgia   [place] [facility] [place], Salvadore Oxford, NP   8 months ago Primary hypertension   [place] [facility] [place], Salvadore Oxford, NP   9 months ago Encounter for general adult medical examination with abnormal findings   [place] [facility] [place], Salvadore Oxford, NP   1 year ago Chronic fatigue   [place] [facility] [place], Salvadore Oxford, NP       Future Appointments             In 3 weeks Willeen Niece, MD [facility]

## 2022-10-31 DIAGNOSIS — F329 Major depressive disorder, single episode, unspecified: Secondary | ICD-10-CM | POA: Diagnosis not present

## 2022-10-31 DIAGNOSIS — F411 Generalized anxiety disorder: Secondary | ICD-10-CM | POA: Diagnosis not present

## 2022-10-31 DIAGNOSIS — F909 Attention-deficit hyperactivity disorder, unspecified type: Secondary | ICD-10-CM | POA: Diagnosis not present

## 2022-10-31 DIAGNOSIS — F4310 Post-traumatic stress disorder, unspecified: Secondary | ICD-10-CM | POA: Diagnosis not present

## 2022-11-01 ENCOUNTER — Other Ambulatory Visit: Payer: Self-pay | Admitting: Internal Medicine

## 2022-11-01 DIAGNOSIS — F4310 Post-traumatic stress disorder, unspecified: Secondary | ICD-10-CM | POA: Diagnosis not present

## 2022-11-02 DIAGNOSIS — R200 Anesthesia of skin: Secondary | ICD-10-CM | POA: Diagnosis not present

## 2022-11-02 NOTE — Telephone Encounter (Signed)
Unable to refill per protocol, Rx request is too soon. Last refill 10/03/22 for 90 days.   Requested Prescriptions  Pending Prescriptions Disp Refills   ibuprofen (ADVIL) 600 MG tablet [Pharmacy Med Name: IBUPROFEN 600 MG TABLET] 90 tablet 0    Sig: TAKE 1 TABLET BY MOUTH EVERY 8 HOURS AS NEEDED     Analgesics:  NSAIDS Failed - 11/01/2022  3:01 AM      Failed - Manual Review: Labs are only required if the patient has taken medication for more than 8 weeks.      Failed - Cr in normal range and within 360 days    Creat  Date Value Ref Range Status  07/12/2022 1.07 (H) 0.50 - 0.99 mg/dL Final         Passed - HGB in normal range and within 360 days    Hemoglobin  Date Value Ref Range Status  07/12/2022 13.8 11.7 - 15.5 g/dL Final         Passed - PLT in normal range and within 360 days    Platelets  Date Value Ref Range Status  07/12/2022 264 140 - 400 Thousand/uL Final         Passed - HCT in normal range and within 360 days    HCT  Date Value Ref Range Status  07/12/2022 41.4 35.0 - 45.0 % Final         Passed - eGFR is 30 or above and within 360 days    eGFR  Date Value Ref Range Status  07/12/2022 66 > OR = 60 mL/min/1.27m2 Final         Passed - Patient is not pregnant      Passed - Valid encounter within last 12 months    Recent Outpatient Visits           3 months ago Primary hypertension   [place] [facility] [place], Salvadore Oxford, NP   4 months ago Fibromyalgia   [facility] [place], Salvadore Oxford, NP   8 months ago Primary hypertension   [place] [place], Salvadore Oxford, NP   9 months ago Encounter for general adult medical examination with abnormal findings   [facility] [place], Salvadore Oxford, NP   1 year ago Chronic fatigue   [place] [facility] [place], Salvadore Oxford, NP       Future Appointments             In 2 weeks Willeen Niece, MD [facility]

## 2022-11-03 ENCOUNTER — Other Ambulatory Visit: Payer: Self-pay | Admitting: Internal Medicine

## 2022-11-04 NOTE — Telephone Encounter (Signed)
Unable to refill per protocol, Rx expired. Discontinued 07/12/22.  Requested Prescriptions  Pending Prescriptions Disp Refills   Olmesartan-amLODIPine-HCTZ 20-5-12.5 MG TABS [Pharmacy Med Name: OLMSRTN-AMLDPN-HCTZ 20-5-12.5] 30 tablet 2    Sig: TAKE 1 TABLET BY MOUTH EVERY DAY     Cardiovascular: CCB + ARB + Diuretic Combos Failed - 11/03/2022  5:51 PM      Failed - Cr in normal range and within 180 days    Creat  Date Value Ref Range Status  07/12/2022 1.07 (H) 0.50 - 0.99 mg/dL Final         Passed - K in normal range and within 180 days    Potassium  Date Value Ref Range Status  07/12/2022 5.1 3.5 - 5.3 mmol/L Final         Passed - Na in normal range and within 180 days    Sodium  Date Value Ref Range Status  07/12/2022 139 135 - 146 mmol/L Final         Passed - eGFR is 10 or above and within 180 days    eGFR  Date Value Ref Range Status  07/12/2022 66 > OR = 60 mL/min/1.36m2 Final         Passed - Patient is not pregnant      Passed - Last BP in normal range    BP Readings from Last 1 Encounters:  07/12/22 138/77         Passed - Last Heart Rate in normal range    Pulse Readings from Last 1 Encounters:  07/12/22 83         Passed - Valid encounter within last 6 months    Recent Outpatient Visits           3 months ago Primary hypertension   [facility] [place], Salvadore Oxford, NP   4 months ago Fibromyalgia   [facility] [place], Salvadore Oxford, NP   8 months ago Primary hypertension   [facility] [place], Salvadore Oxford, NP   9 months ago Encounter for general adult medical examination with abnormal findings   [facility] [place], Salvadore Oxford, NP   1 year ago Chronic fatigue   [facility] [place], Salvadore Oxford, NP       Future Appointments             In 2 weeks Willeen Niece, MD [facility]

## 2022-11-08 DIAGNOSIS — F4310 Post-traumatic stress disorder, unspecified: Secondary | ICD-10-CM | POA: Diagnosis not present

## 2022-11-16 DIAGNOSIS — G4733 Obstructive sleep apnea (adult) (pediatric): Secondary | ICD-10-CM | POA: Diagnosis not present

## 2022-11-16 DIAGNOSIS — F4310 Post-traumatic stress disorder, unspecified: Secondary | ICD-10-CM | POA: Diagnosis not present

## 2022-11-21 ENCOUNTER — Ambulatory Visit: Payer: 59 | Admitting: Dermatology

## 2022-11-21 ENCOUNTER — Other Ambulatory Visit: Payer: Self-pay | Admitting: Family Medicine

## 2022-11-21 VITALS — BP 145/90

## 2022-11-21 DIAGNOSIS — M5412 Radiculopathy, cervical region: Secondary | ICD-10-CM

## 2022-11-21 DIAGNOSIS — M5416 Radiculopathy, lumbar region: Secondary | ICD-10-CM | POA: Diagnosis not present

## 2022-11-21 DIAGNOSIS — M1611 Unilateral primary osteoarthritis, right hip: Secondary | ICD-10-CM | POA: Diagnosis not present

## 2022-11-21 DIAGNOSIS — L309 Dermatitis, unspecified: Secondary | ICD-10-CM

## 2022-11-21 DIAGNOSIS — M5020 Other cervical disc displacement, unspecified cervical region: Secondary | ICD-10-CM | POA: Diagnosis not present

## 2022-11-21 DIAGNOSIS — M5136 Other intervertebral disc degeneration, lumbar region: Secondary | ICD-10-CM | POA: Diagnosis not present

## 2022-11-21 DIAGNOSIS — M5030 Other cervical disc degeneration, unspecified cervical region: Secondary | ICD-10-CM | POA: Diagnosis not present

## 2022-11-21 DIAGNOSIS — M4802 Spinal stenosis, cervical region: Secondary | ICD-10-CM | POA: Diagnosis not present

## 2022-11-21 DIAGNOSIS — L281 Prurigo nodularis: Secondary | ICD-10-CM | POA: Diagnosis not present

## 2022-11-21 MED ORDER — CLOBETASOL PROPIONATE 0.05 % EX CREA: 1.0000 | TOPICAL_CREAM | CUTANEOUS | 2 refills | Status: DC

## 2022-11-21 NOTE — Patient Instructions (Addendum)
Recommend starting moisturizer with exfoliant (Urea, Salicylic acid, or Lactic acid) one to two times daily to help smooth rough and bumpy skin.  OTC options include Cetaphil Rough and Bumpy lotion (Urea), Eucerin Roughness Relief lotion or spot treatment cream (Urea), CeraVe SA lotion/cream for Rough and Bumpy skin (Sal Acid), Gold Bond Rough and Bumpy cream (Sal Acid), and AmLactin 12% lotion/cream (Lactic Acid).  If applying in morning, also apply sunscreen to sun-exposed areas, since these exfoliating moisturizers can increase sensitivity to sun.   Due to recent changes in healthcare laws, you may see results of your pathology and/or laboratory studies on MyChart before the doctors have had a chance to review them. We understand that in some cases there may be results that are confusing or concerning to you. Please understand that not all results are received at the same time and often the doctors may need to interpret multiple results in order to provide you with the best plan of care or course of treatment. Therefore, we ask that you please give us 2 business days to thoroughly review all your results before contacting the office for clarification. Should we see a critical lab result, you will be contacted sooner.   If You Need Anything After Your Visit  If you have any questions or concerns for your doctor, please call our main line at [phone] and press option 4 to reach your doctor's medical assistant. If no one answers, please leave a voicemail as directed and we will return your call as soon as possible. Messages left after 4 pm will be answered the following business day.   You may also send us a message via MyChart. We typically respond to MyChart messages within 1-2 business days.  For prescription refills, please ask your pharmacy to contact our office. Our fax number is [phone].  If you have an urgent issue when the clinic is closed that cannot wait until the next business day,  you can page your doctor at the number below.    Please note that while we do our best to be available for urgent issues outside of office hours, we are not available 24/7.   If you have an urgent issue and are unable to reach us, you may choose to seek medical care at your doctor's office, retail clinic, urgent care center, or emergency room.  If you have a medical emergency, please immediately call 911 or go to the emergency department.  Pager Numbers  - Dr. Kowalski: [phone]  - Dr. Moye: [phone]  - Dr. Stewart: [phone]  In the event of inclement weather, please call our main line at [phone] for an update on the status of any delays or closures.  Dermatology Medication Tips: Please keep the boxes that topical medications come in in order to help keep track of the instructions about where and how to use these. Pharmacies typically print the medication instructions only on the boxes and not directly on the medication tubes.   If your medication is too expensive, please contact our office at [phone] option 4 or send us a message through MyChart.   We are unable to tell what your co-pay for medications will be in advance as this is different depending on your insurance coverage. However, we may be able to find a substitute medication at lower cost or fill out paperwork to get insurance to cover a needed medication.   If a prior authorization is required to get your medication covered by your insurance company, please allow   us 1-2 business days to complete this process.  Drug prices often vary depending on where the prescription is filled and some pharmacies may offer cheaper prices.  The website www.goodrx.com contains coupons for medications through different pharmacies. The prices here do not account for what the cost may be with help from insurance (it may be cheaper with your insurance), but the website can give you the price if you did not use any insurance.   - You can print the associated coupon and take it with your prescription to the pharmacy.  - You may also stop by our office during regular business hours and pick up a GoodRx coupon card.  - If you need your prescription sent electronically to a different pharmacy, notify our office through  MyChart or by phone at [phone] option 4.     Si Usted Necesita Algo Despus de Su Visita  Tambin puede enviarnos un mensaje a travs de MyChart. Por lo general respondemos a los mensajes de MyChart en el transcurso de 1 a 2 das hbiles.  Para renovar recetas, por favor pida a su farmacia que se ponga en contacto con nuestra oficina. Nuestro nmero de fax es el [phone].  Si tiene un asunto urgente cuando la clnica est cerrada y que no puede esperar hasta el siguiente da hbil, puede llamar/localizar a su doctor(a) al nmero que aparece a continuacin.   Por favor, tenga en cuenta que aunque hacemos todo lo posible para estar disponibles para asuntos urgentes fuera del horario de oficina, no estamos disponibles las 24 horas del da, los 7 das de la semana.   Si tiene un problema urgente y no puede comunicarse con nosotros, puede optar por buscar atencin mdica  en el consultorio de su doctor(a), en una clnica privada, en un centro de atencin urgente o en una sala de emergencias.  Si tiene una emergencia mdica, por favor llame inmediatamente al 911 o vaya a la sala de emergencias.  Nmeros de bper  - Dr. Kowalski: [phone]  - Dra. Moye: [phone]  - Dra. Stewart: [phone]  En caso de inclemencias del tiempo, por favor llame a nuestra lnea principal al [phone] para una actualizacin sobre el estado de cualquier retraso o cierre.  Consejos para la medicacin en dermatologa: Por favor, guarde las cajas en las que vienen los medicamentos de uso tpico para ayudarle a seguir las instrucciones sobre dnde y cmo usarlos. Las farmacias generalmente  imprimen las instrucciones del medicamento slo en las cajas y no directamente en los tubos del medicamento.   Si su medicamento es muy caro, por favor, pngase en contacto con nuestra oficina llamando al [phone] y presione la opcin 4 o envenos un mensaje a travs de MyChart.   No podemos decirle cul ser su copago por los medicamentos por adelantado ya que esto es diferente dependiendo de la cobertura de su seguro. Sin embargo, es posible que podamos encontrar un medicamento sustituto a menor costo o llenar un formulario para que el seguro cubra el medicamento que se considera necesario.   Si se requiere una autorizacin previa para que su compaa de seguros cubra su medicamento, por favor permtanos de 1 a 2 das hbiles para completar este proceso.  Los precios de los medicamentos varan con frecuencia dependiendo del lugar de dnde se surte la receta y alguna farmacias pueden ofrecer precios ms baratos.  El sitio web www.goodrx.com tiene cupones para medicamentos de diferentes farmacias. Los precios aqu no tienen en cuenta lo que podra   costar con la ayuda del seguro (puede ser ms barato con su seguro), pero el sitio web puede darle el precio si no utiliz ningn seguro.  - Puede imprimir el cupn correspondiente y llevarlo con su receta a la farmacia.  - Tambin puede pasar por nuestra oficina durante el horario de atencin regular y recoger una tarjeta de cupones de GoodRx.  - Si necesita que su receta se enve electrnicamente a una farmacia diferente, informe a nuestra oficina a travs de MyChart de [place] o por telfono llamando al [phone] y presione la opcin 4.  

## 2022-11-21 NOTE — Progress Notes (Signed)
   New Patient Visit   Subjective  Sara Andrade is a 42 y.o. female who presents for the following: check spots, hands, fingers, neck, ~30yrs, itchy, no treatment, no hx of psoriasis, pt was adopted, hx of eczema.  She picks at the spots.   The following portions of the chart were reviewed this encounter and updated as appropriate: medications, allergies, medical history  Review of Systems:  No other skin or systemic complaints except as noted in HPI or Assessment and Plan.  Objective  Well appearing patient in no apparent distress; mood and affect are within normal limits.   A focused examination was performed of the following areas: Hands, fingers, neck  Relevant exam findings are noted in the Assessment and Plan.    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 Condition is bothersome/symptomatic for patient. Currently flared.   Treatment Plan Start Clobetasol cr qd/bid aa hands until clear, then prn flares, avoid f/g/a Recommend mild soap and moisturizer qd, Recommend AmLactin cream twice daily Avoid picking/scratching  Hand Dermatitis is a chronic type of eczema that can come and go on the hands and fingers.  While there is no cure, the rash and symptoms can be managed with topical prescription medications, and for more severe cases, with systemic medications.  Recommend mild soap and routine use of moisturizing cream after handwashing.  Minimize soap/water exposure when possible.     Topical steroids (such as triamcinolone, fluocinolone, fluocinonide, mometasone, clobetasol, halobetasol, betamethasone, hydrocortisone) can cause thinning and lightening of the skin if they are used for too long in the same area. Your physician has selected the right strength medicine for your problem and area  affected on the body. Please use your medication only as directed by your physician to prevent side effects.    PRURIGO NODULE Exam: Firm scaly pap inf chin  Treatment Plan: Benign-appearing Start Clobetasol cr qd/bid aa chin until clear, then prn flares, avoid f/g/a  Avoid picking/scratching     Return for 4-6wks Hand derm f/u.  I, Ardis Rowan, RMA, am acting as scribe for Willeen Niece, MD .   Documentation: I have reviewed the above documentation for accuracy and completeness, and I agree with the above.  Willeen Niece, MD

## 2022-11-22 DIAGNOSIS — F4310 Post-traumatic stress disorder, unspecified: Secondary | ICD-10-CM | POA: Diagnosis not present

## 2022-11-29 ENCOUNTER — Other Ambulatory Visit: Payer: Self-pay | Admitting: Family Medicine

## 2022-11-29 DIAGNOSIS — M5410 Radiculopathy, site unspecified: Secondary | ICD-10-CM

## 2022-11-29 DIAGNOSIS — F4310 Post-traumatic stress disorder, unspecified: Secondary | ICD-10-CM | POA: Diagnosis not present

## 2022-12-01 ENCOUNTER — Ambulatory Visit: Payer: 59 | Admitting: Internal Medicine

## 2022-12-01 DIAGNOSIS — F329 Major depressive disorder, single episode, unspecified: Secondary | ICD-10-CM | POA: Diagnosis not present

## 2022-12-01 DIAGNOSIS — F909 Attention-deficit hyperactivity disorder, unspecified type: Secondary | ICD-10-CM | POA: Diagnosis not present

## 2022-12-01 DIAGNOSIS — F411 Generalized anxiety disorder: Secondary | ICD-10-CM | POA: Diagnosis not present

## 2022-12-01 DIAGNOSIS — F4310 Post-traumatic stress disorder, unspecified: Secondary | ICD-10-CM | POA: Diagnosis not present

## 2022-12-06 ENCOUNTER — Telehealth: Payer: Self-pay

## 2022-12-06 DIAGNOSIS — F4310 Post-traumatic stress disorder, unspecified: Secondary | ICD-10-CM | POA: Diagnosis not present

## 2022-12-06 NOTE — Telephone Encounter (Signed)
Pt has a lab only visit 12/21/2022 and an appointment with you 01/02/2023. Pt was due to have her cholesterol rechecked in March. Does she need other labs before seeing you?     Thanks,   -Vernona Rieger

## 2022-12-06 NOTE — Telephone Encounter (Signed)
Not that I am aware of. She may just want to wait until her appt with me to repeat labs.

## 2022-12-06 NOTE — Telephone Encounter (Signed)
Copied from CRM [id number]. Topic: General - Other >> Dec 06, 2022 12:50 PM Franchot Heidelberg wrote: Reason for CRM: Pt is coming in for labs, says she was contacted by PCP. Wants to make sure all of the labs she needs are ordered

## 2022-12-07 NOTE — Telephone Encounter (Signed)
Pt's spouse Toni Amend advised. (On DPR).  She canceled the lab appointment and will keen the appointment on the 17th.   Thanks,   -Vernona Rieger

## 2022-12-13 DIAGNOSIS — F4310 Post-traumatic stress disorder, unspecified: Secondary | ICD-10-CM | POA: Diagnosis not present

## 2022-12-17 ENCOUNTER — Other Ambulatory Visit: Payer: 59

## 2022-12-21 ENCOUNTER — Other Ambulatory Visit: Payer: 59

## 2022-12-23 DIAGNOSIS — F4310 Post-traumatic stress disorder, unspecified: Secondary | ICD-10-CM | POA: Diagnosis not present

## 2022-12-29 DIAGNOSIS — F329 Major depressive disorder, single episode, unspecified: Secondary | ICD-10-CM | POA: Diagnosis not present

## 2022-12-29 DIAGNOSIS — F4310 Post-traumatic stress disorder, unspecified: Secondary | ICD-10-CM | POA: Diagnosis not present

## 2022-12-29 DIAGNOSIS — F411 Generalized anxiety disorder: Secondary | ICD-10-CM | POA: Diagnosis not present

## 2022-12-29 DIAGNOSIS — F909 Attention-deficit hyperactivity disorder, unspecified type: Secondary | ICD-10-CM | POA: Diagnosis not present

## 2022-12-30 DIAGNOSIS — F4310 Post-traumatic stress disorder, unspecified: Secondary | ICD-10-CM | POA: Diagnosis not present

## 2023-01-02 ENCOUNTER — Ambulatory Visit: Payer: 59 | Admitting: Internal Medicine

## 2023-01-03 ENCOUNTER — Ambulatory Visit: Payer: 59 | Admitting: Dermatology

## 2023-01-04 DIAGNOSIS — F4310 Post-traumatic stress disorder, unspecified: Secondary | ICD-10-CM | POA: Diagnosis not present

## 2023-01-07 ENCOUNTER — Ambulatory Visit
Admission: RE | Admit: 2023-01-07 | Discharge: 2023-01-07 | Disposition: A | Discharge status: Home / Self Care | Payer: 59 | Source: Ambulatory Visit | Attending: Family Medicine | Admitting: Family Medicine

## 2023-01-07 DIAGNOSIS — M48061 Spinal stenosis, lumbar region without neurogenic claudication: Secondary | ICD-10-CM | POA: Diagnosis not present

## 2023-01-07 DIAGNOSIS — M5410 Radiculopathy, site unspecified: Secondary | ICD-10-CM

## 2023-01-07 DIAGNOSIS — M5136 Other intervertebral disc degeneration, lumbar region: Secondary | ICD-10-CM | POA: Diagnosis not present

## 2023-01-11 DIAGNOSIS — F4310 Post-traumatic stress disorder, unspecified: Secondary | ICD-10-CM | POA: Diagnosis not present

## 2023-01-18 DIAGNOSIS — F4310 Post-traumatic stress disorder, unspecified: Secondary | ICD-10-CM | POA: Diagnosis not present

## 2023-01-25 DIAGNOSIS — F4310 Post-traumatic stress disorder, unspecified: Secondary | ICD-10-CM | POA: Diagnosis not present

## 2023-01-26 DIAGNOSIS — F411 Generalized anxiety disorder: Secondary | ICD-10-CM | POA: Diagnosis not present

## 2023-01-26 DIAGNOSIS — F4310 Post-traumatic stress disorder, unspecified: Secondary | ICD-10-CM | POA: Diagnosis not present

## 2023-01-26 DIAGNOSIS — F909 Attention-deficit hyperactivity disorder, unspecified type: Secondary | ICD-10-CM | POA: Diagnosis not present

## 2023-01-26 DIAGNOSIS — F329 Major depressive disorder, single episode, unspecified: Secondary | ICD-10-CM | POA: Diagnosis not present

## 2023-01-29 ENCOUNTER — Other Ambulatory Visit: Payer: Self-pay | Admitting: Internal Medicine

## 2023-01-29 DIAGNOSIS — E782 Mixed hyperlipidemia: Secondary | ICD-10-CM

## 2023-01-30 NOTE — Telephone Encounter (Signed)
Requested Prescriptions  Pending Prescriptions Disp Refills   lovastatin (MEVACOR) 10 MG tablet [Pharmacy Med Name: LOVASTATIN 10 MG TABLET] 90 tablet 0    Sig: TAKE 1 TABLET BY MOUTH EVERYDAY AT BEDTIME     Cardiovascular:  Antilipid - Statins 2 Failed - 01/29/2023  8:39 AM      Failed - Cr in normal range and within 360 days    Creat  Date Value Ref Range Status  07/12/2022 1.07 (H) 0.50 - 0.99 mg/dL Final         Failed - Lipid Panel in normal range within the last 12 months    Cholesterol  Date Value Ref Range Status  07/12/2022 244 (H) <200 mg/dL Final   LDL Cholesterol (Calc)  Date Value Ref Range Status  07/12/2022 134 (H) mg/dL (calc) Final    Comment:    Reference range: <100 . Desirable range <100 mg/dL for primary prevention;   <70 mg/dL for patients with CHD or diabetic patients  with > or = 2 CHD risk factors. Marland Kitchen LDL-C is now calculated using the Martin-Hopkins  calculation, which is a validated novel method providing  better accuracy than the Friedewald equation in the  estimation of LDL-C.  Horald Pollen et al. Lenox Ahr. 9563;875(64): 2061-2068  (http://education.[employer].com/faq/FAQ164)    HDL  Date Value Ref Range Status  07/12/2022 82 > OR = 50 mg/dL Final   Triglycerides  Date Value Ref Range Status  07/12/2022 151 (H) <150 mg/dL Final         Passed - Patient is not pregnant      Passed - Valid encounter within last 12 months    Recent Outpatient Visits           6 months ago Primary hypertension   [place] [facility] [place], Salvadore Oxford, NP   7 months ago Fibromyalgia   [place] [facility] [place], Salvadore Oxford, NP   11 months ago Primary hypertension   [place] [facility] [place], Salvadore Oxford, NP   1 year ago Encounter for general adult medical examination with abnormal findings   [facility] [place], Salvadore Oxford, NP   1 year ago Chronic fatigue   [place] [facility] [place], Salvadore Oxford, NP       Future Appointments             In 3 months Willeen Niece, MD [facility]

## 2023-01-31 ENCOUNTER — Other Ambulatory Visit: Payer: Self-pay | Admitting: Internal Medicine

## 2023-02-01 DIAGNOSIS — F4310 Post-traumatic stress disorder, unspecified: Secondary | ICD-10-CM | POA: Diagnosis not present

## 2023-02-01 NOTE — Telephone Encounter (Signed)
Requested medication (s) are due for refill today: yes  Requested medication (s) are on the active medication list: yes  Last refill:  10/26/22  Future visit scheduled: no  Notes to clinic:  Unable to refill per protocol, cannot delegate.      Requested Prescriptions  Pending Prescriptions Disp Refills   methocarbamol (ROBAXIN) 500 MG tablet [Pharmacy Med Name: METHOCARBAMOL 500 MG TABLET] 90 tablet 0    Sig: TAKE 1 TABLET BY MOUTH EVERY 8 HOURS AS NEEDED FOR MUSCLE SPASMS     Not Delegated - Analgesics:  Muscle Relaxants Failed - 01/31/2023 10:15 AM      Failed - This refill cannot be delegated      Failed - Valid encounter within last 6 months    Recent Outpatient Visits           6 months ago Primary hypertension   [place] [facility] [place], Salvadore Oxford, NP   7 months ago Fibromyalgia   [place] [facility] [place], Salvadore Oxford, NP   11 months ago Primary hypertension   [place] [facility] [place], Salvadore Oxford, NP   1 year ago Encounter for general adult medical examination with abnormal findings   [place] [facility] [place], Salvadore Oxford, NP   1 year ago Chronic fatigue   [place] [facility] [place], Salvadore Oxford, NP       Future Appointments             In 3 months Willeen Niece, MD [facility]

## 2023-02-08 DIAGNOSIS — F4310 Post-traumatic stress disorder, unspecified: Secondary | ICD-10-CM | POA: Diagnosis not present

## 2023-02-15 ENCOUNTER — Ambulatory Visit: Payer: 59 | Admitting: Dermatology

## 2023-02-15 DIAGNOSIS — F4310 Post-traumatic stress disorder, unspecified: Secondary | ICD-10-CM | POA: Diagnosis not present

## 2023-02-16 ENCOUNTER — Other Ambulatory Visit: Payer: Self-pay | Admitting: Family Medicine

## 2023-02-16 DIAGNOSIS — M5412 Radiculopathy, cervical region: Secondary | ICD-10-CM | POA: Diagnosis not present

## 2023-02-16 DIAGNOSIS — M5030 Other cervical disc degeneration, unspecified cervical region: Secondary | ICD-10-CM | POA: Diagnosis not present

## 2023-02-16 DIAGNOSIS — M1611 Unilateral primary osteoarthritis, right hip: Secondary | ICD-10-CM | POA: Diagnosis not present

## 2023-02-16 DIAGNOSIS — M5136 Other intervertebral disc degeneration, lumbar region: Secondary | ICD-10-CM | POA: Diagnosis not present

## 2023-02-16 DIAGNOSIS — M4802 Spinal stenosis, cervical region: Secondary | ICD-10-CM | POA: Diagnosis not present

## 2023-02-16 DIAGNOSIS — M25551 Pain in right hip: Secondary | ICD-10-CM | POA: Diagnosis not present

## 2023-02-21 DIAGNOSIS — F4310 Post-traumatic stress disorder, unspecified: Secondary | ICD-10-CM | POA: Diagnosis not present

## 2023-02-28 ENCOUNTER — Ambulatory Visit (INDEPENDENT_AMBULATORY_CARE_PROVIDER_SITE_OTHER): Payer: 59 | Admitting: Internal Medicine

## 2023-02-28 ENCOUNTER — Encounter: Payer: Self-pay | Admitting: Internal Medicine

## 2023-02-28 VITALS — BP 145/107 | HR 78 | Temp 96.7°F | Wt 201.0 lb

## 2023-02-28 DIAGNOSIS — K529 Noninfective gastroenteritis and colitis, unspecified: Secondary | ICD-10-CM

## 2023-02-28 DIAGNOSIS — N62 Hypertrophy of breast: Secondary | ICD-10-CM

## 2023-02-28 DIAGNOSIS — M546 Pain in thoracic spine: Secondary | ICD-10-CM | POA: Diagnosis not present

## 2023-02-28 DIAGNOSIS — R7303 Prediabetes: Secondary | ICD-10-CM

## 2023-02-28 DIAGNOSIS — F900 Attention-deficit hyperactivity disorder, predominantly inattentive type: Secondary | ICD-10-CM | POA: Diagnosis not present

## 2023-02-28 DIAGNOSIS — M549 Dorsalgia, unspecified: Secondary | ICD-10-CM | POA: Diagnosis not present

## 2023-02-28 DIAGNOSIS — Z6836 Body mass index (BMI) 36.0-36.9, adult: Secondary | ICD-10-CM

## 2023-02-28 DIAGNOSIS — F4310 Post-traumatic stress disorder, unspecified: Secondary | ICD-10-CM

## 2023-02-28 DIAGNOSIS — K219 Gastro-esophageal reflux disease without esophagitis: Secondary | ICD-10-CM

## 2023-02-28 DIAGNOSIS — I10 Essential (primary) hypertension: Secondary | ICD-10-CM

## 2023-02-28 DIAGNOSIS — F419 Anxiety disorder, unspecified: Secondary | ICD-10-CM

## 2023-02-28 DIAGNOSIS — E782 Mixed hyperlipidemia: Secondary | ICD-10-CM

## 2023-02-28 DIAGNOSIS — F32A Depression, unspecified: Secondary | ICD-10-CM

## 2023-02-28 DIAGNOSIS — M545 Low back pain, unspecified: Secondary | ICD-10-CM

## 2023-02-28 DIAGNOSIS — G8929 Other chronic pain: Secondary | ICD-10-CM

## 2023-02-28 DIAGNOSIS — M797 Fibromyalgia: Secondary | ICD-10-CM

## 2023-02-28 LAB — CBC
HCT: 43.1 % (ref 35.0–45.0)
Hemoglobin: 14.2 g/dL (ref 11.7–15.5)
MCH: 27.9 pg (ref 27.0–33.0)
MCHC: 32.9 g/dL (ref 32.0–36.0)
MCV: 84.7 fL (ref 80.0–100.0)
MPV: 10.8 fL (ref 7.5–12.5)
Platelets: 287 10*3/uL (ref 140–400)
RBC: 5.09 10*6/uL (ref 3.80–5.10)
RDW: 12.9 % (ref 11.0–15.0)
WBC: 7.1 10*3/uL (ref 3.8–10.8)

## 2023-02-28 MED ORDER — OLMESARTAN MEDOXOMIL 20 MG PO TABS: 20.0000 mg | ORAL_TABLET | Freq: Every day | ORAL | 0 refills | Status: DC

## 2023-02-28 NOTE — Assessment & Plan Note (Signed)
Continue methocarbamol and gabapentin Encouraged regular stretching

## 2023-02-28 NOTE — Assessment & Plan Note (Signed)
A1c today Encouraged low carb diet and exercise for weight loss 

## 2023-02-28 NOTE — Assessment & Plan Note (Signed)
Encourage diet and exercise for weight loss 

## 2023-02-28 NOTE — Assessment & Plan Note (Signed)
Continue gabapentin and methocarbamol Continue regular stretching

## 2023-02-28 NOTE — Assessment & Plan Note (Signed)
 Referral to GI for further evaluation and treatment

## 2023-02-28 NOTE — Patient Instructions (Signed)
Diarrhea, Adult Diarrhea is when you pass loose and sometimes watery poop (stool) often. Diarrhea can make you feel weak and cause you to lose water in your body (get dehydrated). Losing water in your body can cause you to: Feel tired and thirsty. Have a dry mouth. Go pee (urinate) less often. Diarrhea often lasts 2-3 days. It can last longer if it is a sign of something more serious. Be sure to treat your diarrhea as told by your doctor. Follow these instructions at home: Eating and drinking     Follow these instructions as told by your doctor: Take an ORS (oral rehydration solution). This is a drink that helps you replace fluids and minerals your body lost. It is sold at pharmacies and stores. Drink enough fluid to keep your pee (urine) pale yellow. Drink fluids such as: Water. You can also get fluids by sucking on ice chips. Diluted fruit juice. Low-calorie sports drinks. Milk. Avoid drinking fluids that have a lot of sugar or caffeine in them. These include soda, energy drinks, and regular sports drinks. Avoid alcohol. Eat bland, easy-to-digest foods in small amounts as you are able. These foods include: Bananas. Applesauce. Rice. Low-fat (lean) meats. Toast. Crackers. Avoid spicy or fatty foods.  Medicines Take over-the-counter and prescription medicines only as told by your doctor. If you were prescribed antibiotics, take them as told by your doctor. Do not stop taking them even if you start to feel better. General instructions  Wash your hands often using soap and water for 20 seconds. If soap and water are not available, use hand sanitizer. Others in your home should wash their hands as well. Wash your hands: After using the toilet or changing a diaper. Before preparing, cooking, or serving food. While caring for a sick person. While visiting someone in a hospital. Rest at home while you get better. Take a warm bath to help with any burning or pain from having  diarrhea. Watch your condition for any changes. Contact a doctor if: You have a fever. Your diarrhea gets worse. You have new symptoms. You vomit every time you eat or drink. You feel light-headed, dizzy, or you have a headache. You have muscle cramps. You have signs of losing too much water in your body, such as: Dark pee, very little pee, or no pee. Cracked lips. Dry mouth. Sunken eyes. Sleepiness. Weakness. You have bloody or black poop or poop that looks like tar. You have very bad pain, cramping, or bloating in your belly (abdomen). Your skin feels cold and clammy. You feel confused. Get help right away if: You have chest pain. Your heart is beating very quickly. You have trouble breathing or you are breathing very quickly. You feel very weak or you faint. These symptoms may be an emergency. Get help right away. Call 911. Do not wait to see if the symptoms will go away. Do not drive yourself to the hospital. This information is not intended to replace advice given to you by your health care provider. Make sure you discuss any questions you have with your health care provider. Document Revised: 12/21/2021 Document Reviewed: 12/21/2021 Elsevier Patient Education  2024 [employer].

## 2023-02-28 NOTE — Progress Notes (Signed)
Subjective:    Patient ID: Sara Andrade, female    DOB: 05-21-1981, 42 y.o.   MRN: [id number]  HPI  Patient presents the clinic today for follow-up of chronic conditions.  HTN: Her BP today is 148/108. Her BP at home runs 135/87. She is not taking any antihypertensive medication at this time.  There is no ECG on file.  ADHD: She reports mainly innattention.  She recently started taking Qelbree.  She follows with psychiatry.  Anxiety, depression and PTSD: Chronic, managed on diazepam, lamotrigine, clonazepam, prazosin, fluvoxamine and rexulti.  She is currently seeing a therapist and a psychiatrist.  She denies SI/HI.  Fibromyalgia: She reports chronic joint and muscle pain, worse in her back. She feels likes this is being triggered by having large breast.  She would like to discuss getting a breast reduction.  She is taking gabapentin and methocarbamol with some relief of symptoms.  She follows with physiatry.  Chronic diarrhea: She is not currently taking any medications for this.  She would like referral to GI for colonoscopy.  GERD: Triggered by spicy and greasy foods.  She denies breakthrough on omeprazole.  There is no upper GI on file.  Prediabetes: Her last A1c was 5.8%, 06/2022.  She is not taking any oral diabetic medication at this time.  She does not check her sugars.  HLD: Her last LDL was 134, triglycerides 151, 06/2022.  She denies myalgias on lovastatin.  She does not consume a low-fat diet.  Review of Systems     Past Medical History:  Diagnosis Date   Anxiety    Depression    GERD (gastroesophageal reflux disease)    Hypertension     Current Outpatient Medications  Medication Sig Dispense Refill   clobetasol cream (TEMOVATE) 0.05 % Apply 1 Application topically as directed. Qd to bid to aa hands, fingers and inferior chin until clear, then prn flares, avoid face, groin, axilla 60 g 2   clonazePAM (KLONOPIN) 0.5 MG tablet Take 0.5 mg by mouth daily as needed.      diazepam (VALIUM) 5 MG tablet Take 5 mg by mouth every 6 (six) hours as needed.     fluvoxaMINE (LUVOX) 100 MG tablet Take 100 mg by mouth at bedtime.     gabapentin (NEURONTIN) 300 MG capsule Take 300 mg by mouth 3 (three) times daily.     ibuprofen (ADVIL) 600 MG tablet TAKE 1 TABLET BY MOUTH EVERY 8 HOURS AS NEEDED 90 tablet 0   lamoTRIgine (LAMICTAL) 100 MG tablet Take 100 mg by mouth daily.     lovastatin (MEVACOR) 10 MG tablet TAKE 1 TABLET BY MOUTH EVERYDAY AT BEDTIME 90 tablet 0   methocarbamol (ROBAXIN) 500 MG tablet TAKE 1 TABLET BY MOUTH EVERY 8 HOURS AS NEEDED FOR MUSCLE SPASM 90 tablet 0   prazosin (MINIPRESS) 5 MG capsule Take 5 mg by mouth at bedtime.     REXULTI 2 MG TABS tablet Take 2 mg by mouth daily.     No current facility-administered medications for this visit.    Allergies  Allergen Reactions   Bee Venom Rash   Escitalopram Other (See Comments)   Lactose Diarrhea    Gas    Prednisone     Other reaction(s): Unknown   Sertraline Diarrhea    Other reaction(s): Other (See Comments), Other (See Comments) Emotional numbness Emotional numbness    Lisinopril Cough and Other (See Comments)    cough cough     Family History  Adopted:  Yes    Social History   Socioeconomic History   Marital status: Married    Spouse name: Not on file   Number of children: Not on file   Years of education: Not on file   Highest education level: Associate degree: occupational, Scientist, product/process development, or vocational program  Occupational History   Not on file  Tobacco Use   Smoking status: Former    Current packs/day: 0.00    Average packs/day: 1 pack/day for 3.0 years (3.0 ttl pk-yrs)    Types: Cigarettes    Start date: 2009    Quit date: 2012    Years since quitting: 12.6   Smokeless tobacco: Never  Vaping Use   Vaping status: Never Used  Substance and Sexual Activity   Alcohol use: Yes   Drug use: Never   Sexual activity: Not on file  Other Topics Concern   Not on  file  Social History Narrative   Not on file   Social Determinants of Health   Financial Resource Strain: Patient Declined (12/27/2022)   Overall Financial Resource Strain (CARDIA)    Difficulty of Paying Living Expenses: Patient declined  Food Insecurity: Patient Declined (12/27/2022)   Hunger Vital Sign    Worried About Running Out of Food in the Last Year: Patient declined    Ran Out of Food in the Last Year: Patient declined  Transportation Needs: Patient Declined (12/27/2022)   PRAPARE - Administrator, Civil Service (Medical): Patient declined    Lack of Transportation (Non-Medical): Patient declined  Physical Activity: Unknown (12/27/2022)   Exercise Vital Sign    Days of Exercise per Week: Patient declined    Minutes of Exercise per Session: Not on file  Stress: Stress Concern Present (12/27/2022)   [employer] of Occupational Health - Occupational Stress Questionnaire    Feeling of Stress : Very much  Social Connections: Unknown (12/27/2022)   Social Connection and Isolation Panel [NHANES]    Frequency of Communication with Friends and Family: Patient declined    Frequency of Social Gatherings with Friends and Family: Patient declined    Attends Religious Services: Patient declined    Database administrator or Organizations: Patient declined    Attends Engineer, structural: Not on file    Marital Status: Married  Catering manager Violence: Not on file     Constitutional: Denies fever, malaise, fatigue, headache or abrupt weight changes.  HEENT: Denies eye pain, eye redness, ear pain, ringing in the ears, wax buildup, runny nose, nasal congestion, bloody nose, or sore throat. Respiratory: Denies difficulty breathing, shortness of breath, cough or sputum production.   Cardiovascular: Denies chest pain, chest tightness, palpitations or swelling in the hands or feet.  Gastrointestinal: Patient reports intermittent reflux, chronic diarrhea.  Denies  abdominal pain, bloating, constipation, or blood in the stool.  GU: Denies urgency, frequency, pain with urination, burning sensation, blood in urine, odor or discharge. Musculoskeletal: Patient reports chronic joint and muscle pain, worse in her neck and back.  Denies decrease in range of motion, difficulty with gait, or joint swelling.  Skin: Denies redness, rashes, lesions or ulcercations.  Neurological: Patient reports inattention, paresthesia of upper and lower extremities.  Denies dizziness, difficulty with memory, difficulty with speech or problems with balance and coordination.  Psych: Patient has a history of anxiety and depression.  Denies SI/HI.  No other specific complaints in a complete review of systems (except as listed in HPI above).  Objective:   Physical Exam  BP (!) 145/107 (BP Location: Left Arm, Patient Position: Sitting, Cuff Size: Normal)   Pulse 78   Temp (!) 96.7 F (35.9 C) (Temporal)   Wt 201 lb (91.2 kg)   SpO2 96%   BMI 36.76 kg/m   Wt Readings from Last 3 Encounters:  07/12/22 220 lb (99.8 kg)  02/24/22 218 lb (98.9 kg)  01/12/22 217 lb (98.4 kg)    General: Appears her stated age, obese, in NAD. Skin: Warm, dry and intact.  Indentions noted in her shoulders from bra. HEENT: Head: normal shape and size; Eyes: sclera white, no icterus, conjunctiva pink, PERRLA and EOMs intact;  Cardiovascular: Normal rate and rhythm. S1,S2 noted.  No murmur, rubs or gallops noted. No JVD or BLE edema.  Pulmonary/Chest: Normal effort and positive vesicular breath sounds. No respiratory distress. No wheezes, rales or ronchi noted.  Abdomen: Soft and nontender.  Hyperactive bowel sounds.  Musculoskeletal: Bony tenderness noted throughout the cervical, thoracic and lumbar spine pain with palpation of bilateral paralumbar muscles.  Strength 5/5 BUE, 4/5 BLE.  No difficulty with gait.  Neurological: Alert and oriented. Cranial nerves II-XII grossly intact. Coordination  normal.  Psychiatric: Mood and affect normal.  Very anxious appearing. Judgment and thought content normal.    BMET    Component Value Date/Time   NA 139 07/12/2022 0827   K 5.1 07/12/2022 0827   CL 103 07/12/2022 0827   CO2 27 07/12/2022 0827   GLUCOSE 106 (H) 07/12/2022 0827   BUN 19 07/12/2022 0827   CREATININE 1.07 (H) 07/12/2022 0827   CALCIUM 9.7 07/12/2022 0827    Lipid Panel     Component Value Date/Time   CHOL 244 (H) 07/12/2022 0827   TRIG 151 (H) 07/12/2022 0827   HDL 82 07/12/2022 0827   CHOLHDL 3.0 07/12/2022 0827   LDLCALC 134 (H) 07/12/2022 0827    CBC    Component Value Date/Time   WBC 7.1 07/12/2022 0827   RBC 4.85 07/12/2022 0827   HGB 13.8 07/12/2022 0827   HCT 41.4 07/12/2022 0827   PLT 264 07/12/2022 0827   MCV 85.4 07/12/2022 0827   MCH 28.5 07/12/2022 0827   MCHC 33.3 07/12/2022 0827   RDW 12.9 07/12/2022 0827    Hgb A1C Lab Results  Component Value Date   HGBA1C 5.8 (H) 07/12/2022           Assessment & Plan:   Chronic back pain secondary to large breasts:  Referral to general surgery to discuss breast reduction  RTC in in 2 weeks, follow-up HTN 6 months for your annual exam Nicki Reaper, NP

## 2023-02-28 NOTE — Assessment & Plan Note (Addendum)
Continue clonazepam, diazepam, lamotrigine, prazosin, fluvoxamine and Rexulti per psychiatry

## 2023-02-28 NOTE — Assessment & Plan Note (Signed)
Continue gabapentin and methocarbamol Encouraged regular stretching

## 2023-02-28 NOTE — Assessment & Plan Note (Signed)
C-Met and lipid profile today Encouraged her to consume a low-fat diet Continue lovastatin

## 2023-02-28 NOTE — Assessment & Plan Note (Signed)
Continue meds per psychiatry ?

## 2023-02-28 NOTE — Assessment & Plan Note (Signed)
Continue Qelbree per psychiatry

## 2023-02-28 NOTE — Assessment & Plan Note (Signed)
Avoid foods that trigger reflux Encourage weight loss as this can help reduce reflux symptoms Continue omeprazole 

## 2023-02-28 NOTE — Assessment & Plan Note (Signed)
Uncontrolled off meds We will start olmesartan 20 mg daily Reinforced DASH diet and exercise for weight loss C-Met today

## 2023-03-01 ENCOUNTER — Other Ambulatory Visit: Payer: Self-pay | Admitting: Family Medicine

## 2023-03-01 ENCOUNTER — Encounter: Payer: Self-pay | Admitting: Internal Medicine

## 2023-03-01 DIAGNOSIS — G8929 Other chronic pain: Secondary | ICD-10-CM

## 2023-03-01 DIAGNOSIS — N62 Hypertrophy of breast: Secondary | ICD-10-CM

## 2023-03-01 DIAGNOSIS — M25551 Pain in right hip: Secondary | ICD-10-CM

## 2023-03-02 DIAGNOSIS — F329 Major depressive disorder, single episode, unspecified: Secondary | ICD-10-CM | POA: Diagnosis not present

## 2023-03-02 DIAGNOSIS — F411 Generalized anxiety disorder: Secondary | ICD-10-CM | POA: Diagnosis not present

## 2023-03-02 DIAGNOSIS — F4310 Post-traumatic stress disorder, unspecified: Secondary | ICD-10-CM | POA: Diagnosis not present

## 2023-03-02 DIAGNOSIS — F909 Attention-deficit hyperactivity disorder, unspecified type: Secondary | ICD-10-CM | POA: Diagnosis not present

## 2023-03-03 DIAGNOSIS — F4310 Post-traumatic stress disorder, unspecified: Secondary | ICD-10-CM | POA: Diagnosis not present

## 2023-03-08 DIAGNOSIS — F4310 Post-traumatic stress disorder, unspecified: Secondary | ICD-10-CM | POA: Diagnosis not present

## 2023-03-14 ENCOUNTER — Ambulatory Visit: Payer: 59 | Admitting: Internal Medicine

## 2023-03-14 NOTE — Progress Notes (Deleted)
Subjective:    Patient ID: Sara Andrade, female    DOB: [birth year], 42 y.o.   MRN: [id number]  HPI  Patient presents to clinic today for 2-week follow-up of HTN.  At her last visit, she was started on olmesartan 20 mg.  She has been taking the medication as prescribed.  Her BP today is.  There is no ECG on file.  Review of Systems     Past Medical History:  Diagnosis Date   Anxiety    Depression    GERD (gastroesophageal reflux disease)    Hypertension     Current Outpatient Medications  Medication Sig Dispense Refill   clonazePAM (KLONOPIN) 0.5 MG tablet Take 0.5 mg by mouth daily as needed.     diazepam (VALIUM) 5 MG tablet Take 5 mg by mouth every 6 (six) hours as needed.     fluvoxaMINE (LUVOX) 100 MG tablet Take 100 mg by mouth at bedtime.     gabapentin (NEURONTIN) 300 MG capsule Take 300 mg by mouth 3 (three) times daily.     ibuprofen (ADVIL) 600 MG tablet TAKE 1 TABLET BY MOUTH EVERY 8 HOURS AS NEEDED 90 tablet 0   lamoTRIgine (LAMICTAL) 100 MG tablet Take 200 mg by mouth daily.     lovastatin (MEVACOR) 10 MG tablet TAKE 1 TABLET BY MOUTH EVERYDAY AT BEDTIME 90 tablet 0   methocarbamol (ROBAXIN) 500 MG tablet TAKE 1 TABLET BY MOUTH EVERY 8 HOURS AS NEEDED FOR MUSCLE SPASM 90 tablet 0   olmesartan (BENICAR) 20 MG tablet Take 1 tablet (20 mg total) by mouth daily. 30 tablet 0   prazosin (MINIPRESS) 5 MG capsule Take 5 mg by mouth at bedtime.     REXULTI 2 MG TABS tablet Take 2 mg by mouth daily.     No current facility-administered medications for this visit.    Allergies  Allergen Reactions   Bee Venom Rash   Escitalopram Other (See Comments)   Lactose Diarrhea    Gas    Prednisone     Other reaction(s): Unknown   Sertraline Diarrhea    Other reaction(s): Other (See Comments), Other (See Comments) Emotional numbness Emotional numbness    Lisinopril Cough and Other (See Comments)    cough cough     Family History  Adopted: Yes    Social History    Socioeconomic History   Marital status: Married    Spouse name: Not on file   Number of children: Not on file   Years of education: Not on file   Highest education level: Associate degree: occupational, Scientist, product/process development, or vocational program  Occupational History   Not on file  Tobacco Use   Smoking status: Former    Current packs/day: 0.00    Average packs/day: 1 pack/day for 3.0 years (3.0 ttl pk-yrs)    Types: Cigarettes    Start date: 2009    Quit date: 2012    Years since quitting: 12.6   Smokeless tobacco: Never  Vaping Use   Vaping status: Never Used  Substance and Sexual Activity   Alcohol use: Yes   Drug use: Never   Sexual activity: Not on file  Other Topics Concern   Not on file  Social History Narrative   Not on file   Social Determinants of Health   Financial Resource Strain: Patient Declined (12/27/2022)   Overall Financial Resource Strain (CARDIA)    Difficulty of Paying Living Expenses: Patient declined  Food Insecurity: Patient Declined (12/27/2022)   Hunger  Vital Sign    Worried About Programme researcher, broadcasting/film/video in the Last Year: Patient declined    Ran Out of Food in the Last Year: Patient declined  Transportation Needs: Patient Declined (12/27/2022)   PRAPARE - Administrator, Civil Service (Medical): Patient declined    Lack of Transportation (Non-Medical): Patient declined  Physical Activity: Unknown (12/27/2022)   Exercise Vital Sign    Days of Exercise per Week: Patient declined    Minutes of Exercise per Session: Not on file  Stress: Stress Concern Present (12/27/2022)   [employer] of Occupational Health - Occupational Stress Questionnaire    Feeling of Stress : Very much  Social Connections: Unknown (12/27/2022)   Social Connection and Isolation Panel [NHANES]    Frequency of Communication with Friends and Family: Patient declined    Frequency of Social Gatherings with Friends and Family: Patient declined    Attends Religious Services:  Patient declined    Database administrator or Organizations: Patient declined    Attends Engineer, structural: Not on file    Marital Status: Married  Catering manager Violence: Not on file     Constitutional: Denies fever, malaise, fatigue, headache or abrupt weight changes.  HEENT: Denies eye pain, eye redness, ear pain, ringing in the ears, wax buildup, runny nose, nasal congestion, bloody nose, or sore throat. Respiratory: Denies difficulty breathing, shortness of breath, cough or sputum production.   Cardiovascular: Denies chest pain, chest tightness, palpitations or swelling in the hands or feet.  Gastrointestinal: Patient reports intermittent diarrhea.  Denies abdominal pain, bloating, constipation, or blood in the stool.  GU: Denies urgency, frequency, pain with urination, burning sensation, blood in urine, odor or discharge. Musculoskeletal: Has a history of chronic muscle and joint pain.  Denies decrease in range of motion, difficulty with gait, or joint swelling.  Skin: Denies redness, rashes, lesions or ulcercations.  Neurological: Patient reports inattention.  Denies dizziness, difficulty with memory, difficulty with speech or problems with balance and coordination.  Psych: Patient has a history of anxiety and depression.  Denies SI/HI.  No other specific complaints in a complete review of systems (except as listed in HPI above).  Objective:   Physical Exam  There were no vitals taken for this visit. Wt Readings from Last 3 Encounters:  02/28/23 201 lb (91.2 kg)  07/12/22 220 lb (99.8 kg)  02/24/22 218 lb (98.9 kg)    General: Appears their stated age, well developed, well nourished in NAD. Skin: Warm, dry and intact. No rashes, lesions or ulcerations noted. HEENT: Head: normal shape and size; Eyes: sclera white, no icterus, conjunctiva pink, PERRLA and EOMs intact; Ears: Tm's gray and intact, normal light reflex; Nose: mucosa pink and moist, septum midline;  Throat/Mouth: Teeth present, mucosa pink and moist, no exudate, lesions or ulcerations noted.  Neck:  Neck supple, trachea midline. No masses, lumps or thyromegaly present.  Cardiovascular: Normal rate and rhythm. S1,S2 noted.  No murmur, rubs or gallops noted. No JVD or BLE edema. No carotid bruits noted. Pulmonary/Chest: Normal effort and positive vesicular breath sounds. No respiratory distress. No wheezes, rales or ronchi noted.  Abdomen: Soft and nontender. Normal bowel sounds. No distention or masses noted. Liver, spleen and kidneys non palpable. Musculoskeletal: Normal range of motion. No signs of joint swelling. No difficulty with gait.  Neurological: Alert and oriented. Cranial nerves II-XII grossly intact. Coordination normal.  Psychiatric: Mood and affect normal. Behavior is normal. Judgment and thought content normal.  EKG:  BMET    Component Value Date/Time   NA 140 02/28/2023 1108   K 4.6 02/28/2023 1108   CL 104 02/28/2023 1108   CO2 26 02/28/2023 1108   GLUCOSE 107 (H) 02/28/2023 1108   BUN 15 02/28/2023 1108   CREATININE 1.18 (H) 02/28/2023 1108   CALCIUM 9.1 02/28/2023 1108    Lipid Panel     Component Value Date/Time   CHOL 199 02/28/2023 1108   TRIG 121 02/28/2023 1108   HDL 66 02/28/2023 1108   CHOLHDL 3.0 02/28/2023 1108   LDLCALC 110 (H) 02/28/2023 1108    CBC    Component Value Date/Time   WBC 7.1 02/28/2023 1108   RBC 5.09 02/28/2023 1108   HGB 14.2 02/28/2023 1108   HCT 43.1 02/28/2023 1108   PLT 287 02/28/2023 1108   MCV 84.7 02/28/2023 1108   MCH 27.9 02/28/2023 1108   MCHC 32.9 02/28/2023 1108   RDW 12.9 02/28/2023 1108    Hgb A1C Lab Results  Component Value Date   HGBA1C 5.9 (H) 02/28/2023            Assessment & Plan:     RTC in 6 months, follow-up chronic conditions Nicki Reaper, NP

## 2023-03-15 ENCOUNTER — Telehealth: Payer: Self-pay

## 2023-03-15 DIAGNOSIS — F4310 Post-traumatic stress disorder, unspecified: Secondary | ICD-10-CM | POA: Diagnosis not present

## 2023-03-15 NOTE — Telephone Encounter (Addendum)
So this patient have a new referral for Chronic diarrhea. This is Dr Johnney Killian patient.  Do patient need an office visit for this new problem? Patient cancel colonoscopy with Dr Tobi Bastos back last year on 12/31/2021 which was for rectal bleeding.

## 2023-03-15 NOTE — Telephone Encounter (Signed)
Pt requesting call back to schedule colonoscopy.

## 2023-03-15 NOTE — Telephone Encounter (Signed)
Dr. Tobi Bastos, please let us know what you would like for [place] to do. Thank you.

## 2023-03-16 ENCOUNTER — Ambulatory Visit: Payer: Self-pay

## 2023-03-16 NOTE — Telephone Encounter (Signed)
She needs to take her medication consistently.

## 2023-03-16 NOTE — Telephone Encounter (Signed)
Needs new office visit - cannot skip the q- think next appointments are in October  , she has cancelled her procedures twice and will be dc if she cancels office visit or procedure yet again

## 2023-03-16 NOTE — Telephone Encounter (Signed)
  Chief Complaint: High BP reading Symptoms: none Frequency: last night Pertinent Negatives: Patient denies HA, SOB Disposition: [] ED /[] Urgent Care (no appt availability in office) / [x] Appointment(In office/virtual)/ []  [facility]/ [] Home Care/ [] Refused Recommended Disposition /[] [facility]/ []  Follow-up with PCP Additional Notes: Called back and spoke with Toni Amend, pt's wife. Toni Amend states that last night pt's bp was 160/99, this morning it was 123/89. Toni Amend states that pt does not always take her medication. She also states that BP measurements are not taken using same technique each time. Advised Courtney to take BP using proper technique and  record Bps for provider review. Toni Amend will call if Bps are over 160/100.  Scheduled MyChart VV for 1 week from today. Toni Amend will message provider with BP log before upcoming VV. Appt made as In office. Sent teams message to [facility] to change to MyChart.  Summary: medication dosage + HIGH BLOOD PRESSURE   Patient's wife, Toni Amend called in regards to patient's blood pressure still being high, even though she is on olmesartan (BENICAR) 20 MG tablet. Patient's wife was questioning if patient's medication dosage should be moved back up to 40MG .  Latest reading was 160/99.  Please advise.  Patient's wife callback # [phone] (patients wife is on the dpr)     Reason for Disposition  [1] Systolic BP  >= 130 OR Diastolic >= 80 AND [2] taking BP medications  Answer Assessment - Initial Assessment Questions 1. BLOOD PRESSURE: "What is the blood pressure?" "Did you take at least two measurements 5 minutes apart?"     No - once last night one this morning. 2. ONSET: "When did you take your blood pressure?"     Last nigh and this morning 3. HOW: "How did you take your blood pressure?" (e.g., automatic home BP monitor, visiting nurse)     Automatic 4. HISTORY: "Do you have a history of high blood pressure?"     yes 5.  MEDICINES: "Are you taking any medicines for blood pressure?" "Have you missed any doses recently?"     olmesartan 6. OTHER SYMPTOMS: "Do you have any symptoms?" (e.g., blurred vision, chest pain, difficulty breathing, headache, weakness)    no  Protocols used: Blood Pressure - High-A-AH

## 2023-03-17 NOTE — Telephone Encounter (Signed)
Deshana, can you please schedule an appointment with Dr. Tobi Bastos whenever he has a slot open. Thank you.

## 2023-03-22 ENCOUNTER — Other Ambulatory Visit: Payer: Self-pay | Admitting: Internal Medicine

## 2023-03-22 DIAGNOSIS — F4310 Post-traumatic stress disorder, unspecified: Secondary | ICD-10-CM | POA: Diagnosis not present

## 2023-03-23 ENCOUNTER — Other Ambulatory Visit: Payer: Self-pay | Admitting: Internal Medicine

## 2023-03-23 ENCOUNTER — Encounter: Payer: Self-pay | Admitting: Family Medicine

## 2023-03-23 ENCOUNTER — Telehealth (INDEPENDENT_AMBULATORY_CARE_PROVIDER_SITE_OTHER): Payer: 59 | Admitting: Internal Medicine

## 2023-03-23 ENCOUNTER — Encounter: Payer: Self-pay | Admitting: Internal Medicine

## 2023-03-23 DIAGNOSIS — I10 Essential (primary) hypertension: Secondary | ICD-10-CM

## 2023-03-23 DIAGNOSIS — Z87891 Personal history of nicotine dependence: Secondary | ICD-10-CM | POA: Diagnosis not present

## 2023-03-23 DIAGNOSIS — Z6836 Body mass index (BMI) 36.0-36.9, adult: Secondary | ICD-10-CM | POA: Diagnosis not present

## 2023-03-23 MED ORDER — OLMESARTAN MEDOXOMIL 20 MG PO TABS: 20.0000 mg | ORAL_TABLET | Freq: Every day | ORAL | 1 refills | Status: DC

## 2023-03-23 MED ORDER — OLMESARTAN MEDOXOMIL 40 MG PO TABS: 40.0000 mg | ORAL_TABLET | Freq: Every day | ORAL | 0 refills | Status: DC

## 2023-03-23 NOTE — Telephone Encounter (Signed)
Requested Prescriptions  Pending Prescriptions Disp Refills   olmesartan (BENICAR) 20 MG tablet [Pharmacy Med Name: OLMESARTAN MEDOXOMIL 20 MG TAB] 90 tablet 1    Sig: TAKE 1 TABLET BY MOUTH EVERY DAY     Cardiovascular:  Angiotensin Receptor Blockers Failed - 03/22/2023 11:32 AM      Failed - Cr in normal range and within 180 days    Creat  Date Value Ref Range Status  02/28/2023 1.18 (H) 0.50 - 0.99 mg/dL Final         Failed - Last BP in normal range    BP Readings from Last 1 Encounters:  02/28/23 (!) 145/107         Passed - K in normal range and within 180 days    Potassium  Date Value Ref Range Status  02/28/2023 4.6 3.5 - 5.3 mmol/L Final         Passed - Patient is not pregnant      Passed - Valid encounter within last 6 months    Recent Outpatient Visits           3 weeks ago Prediabetes   [facility] [place], Salvadore Oxford, NP   8 months ago Primary hypertension   [place] [place], Salvadore Oxford, NP   9 months ago Fibromyalgia   [place] [facility] [place], Salvadore Oxford, NP   1 year ago Primary hypertension   [place] [facility] [place], Salvadore Oxford, NP   1 year ago Encounter for general adult medical examination with abnormal findings   [place] [facility] [place], Salvadore Oxford, NP       Future Appointments             Today Sampson Si, Salvadore Oxford, NP  [facility] [place] [facility]

## 2023-03-23 NOTE — Patient Instructions (Signed)

## 2023-03-23 NOTE — Progress Notes (Signed)
Virtual Visit via Video Note  I connected with Sara Andrade on 03/23/23 at  3:20 PM EDT by a video enabled telemedicine application and verified that I am speaking with the correct person using two identifiers.  Location: Patient: Home Provider: Office  Persons participating in this video call: Nicki Reaper, NP and Hale Bogus   I discussed the limitations of evaluation and management by telemedicine and the availability of in person appointments. The patient expressed understanding and agreed to proceed.  History of Present Illness:  Patient due for follow-up of HTN.  At her last visit, she was started on olmesartan 20 mg daily.  Her BP at home has been running 153/103, 145/92.  She denies headaches, dizziness, vision changes, chest pain, shortness of breath or swelling in her legs.  There is no ECG on file.   Past Medical History:  Diagnosis Date   Anxiety    Depression    GERD (gastroesophageal reflux disease)    Hypertension     Current Outpatient Medications  Medication Sig Dispense Refill   clonazePAM (KLONOPIN) 0.5 MG tablet Take 0.5 mg by mouth daily as needed.     diazepam (VALIUM) 5 MG tablet Take 5 mg by mouth every 6 (six) hours as needed.     fluvoxaMINE (LUVOX) 100 MG tablet Take 100 mg by mouth at bedtime.     gabapentin (NEURONTIN) 300 MG capsule Take 300 mg by mouth 3 (three) times daily.     ibuprofen (ADVIL) 600 MG tablet TAKE 1 TABLET BY MOUTH EVERY 8 HOURS AS NEEDED 90 tablet 0   lamoTRIgine (LAMICTAL) 100 MG tablet Take 200 mg by mouth daily.     lovastatin (MEVACOR) 10 MG tablet TAKE 1 TABLET BY MOUTH EVERYDAY AT BEDTIME 90 tablet 0   methocarbamol (ROBAXIN) 500 MG tablet TAKE 1 TABLET BY MOUTH EVERY 8 HOURS AS NEEDED FOR MUSCLE SPASM 90 tablet 0   olmesartan (BENICAR) 20 MG tablet Take 1 tablet (20 mg total) by mouth daily. 30 tablet 0   prazosin (MINIPRESS) 5 MG capsule Take 5 mg by mouth at bedtime.     REXULTI 2 MG TABS tablet Take 2 mg by mouth daily.      No current facility-administered medications for this visit.    Allergies  Allergen Reactions   Bee Venom Rash   Escitalopram Other (See Comments)   Lactose Diarrhea    Gas    Prednisone     Other reaction(s): Unknown   Sertraline Diarrhea    Other reaction(s): Other (See Comments), Other (See Comments) Emotional numbness Emotional numbness    Lisinopril Cough and Other (See Comments)    cough cough     Family History  Adopted: Yes    Social History   Socioeconomic History   Marital status: Married    Spouse name: Not on file   Number of children: Not on file   Years of education: Not on file   Highest education level: Associate degree: occupational, Scientist, product/process development, or vocational program  Occupational History   Not on file  Tobacco Use   Smoking status: Former    Current packs/day: 0.00    Average packs/day: 1 pack/day for 3.0 years (3.0 ttl pk-yrs)    Types: Cigarettes    Start date: 2009    Quit date: 2012    Years since quitting: 12.6   Smokeless tobacco: Never  Vaping Use   Vaping status: Never Used  Substance and Sexual Activity   Alcohol use: Yes  Drug use: Never   Sexual activity: Not on file  Other Topics Concern   Not on file  Social History Narrative   Not on file   Social Determinants of Health   Financial Resource Strain: Patient Declined (12/27/2022)   Overall Financial Resource Strain (CARDIA)    Difficulty of Paying Living Expenses: Patient declined  Food Insecurity: Patient Declined (12/27/2022)   Hunger Vital Sign    Worried About Running Out of Food in the Last Year: Patient declined    Ran Out of Food in the Last Year: Patient declined  Transportation Needs: Patient Declined (12/27/2022)   PRAPARE - Administrator, Civil Service (Medical): Patient declined    Lack of Transportation (Non-Medical): Patient declined  Physical Activity: Unknown (12/27/2022)   Exercise Vital Sign    Days of Exercise per Week: Patient  declined    Minutes of Exercise per Session: Not on file  Stress: Stress Concern Present (12/27/2022)   [employer] of Occupational Health - Occupational Stress Questionnaire    Feeling of Stress : Very much  Social Connections: Unknown (12/27/2022)   Social Connection and Isolation Panel [NHANES]    Frequency of Communication with Friends and Family: Patient declined    Frequency of Social Gatherings with Friends and Family: Patient declined    Attends Religious Services: Patient declined    Database administrator or Organizations: Patient declined    Attends Engineer, structural: Not on file    Marital Status: Married  Catering manager Violence: Not on file     Constitutional: Denies fever, malaise, fatigue, headache or abrupt weight changes.  Respiratory: Denies difficulty breathing, shortness of breath, cough or sputum production.   Cardiovascular: Denies chest pain, chest tightness, palpitations or swelling in the hands or feet.  Neurological: Patient reports inattention.  Denies dizziness, difficulty with memory, difficulty with speech or problems with balance and coordination.  Psych: Patient has a history of anxiety and depression.  Denies SI/HI.  No other specific complaints in a complete review of systems (except as listed in HPI above).    Observations/Objective:  Wt Readings from Last 3 Encounters:  02/28/23 201 lb (91.2 kg)  07/12/22 220 lb (99.8 kg)  02/24/22 218 lb (98.9 kg)    General: Appears her stated age, obese, in NAD. Pulmonary/Chest: Normal effort. No respiratory distress.  Neurological: Alert and oriented.   BMET    Component Value Date/Time   NA 140 02/28/2023 1108   K 4.6 02/28/2023 1108   CL 104 02/28/2023 1108   CO2 26 02/28/2023 1108   GLUCOSE 107 (H) 02/28/2023 1108   BUN 15 02/28/2023 1108   CREATININE 1.18 (H) 02/28/2023 1108   CALCIUM 9.1 02/28/2023 1108    Lipid Panel     Component Value Date/Time   CHOL 199  02/28/2023 1108   TRIG 121 02/28/2023 1108   HDL 66 02/28/2023 1108   CHOLHDL 3.0 02/28/2023 1108   LDLCALC 110 (H) 02/28/2023 1108    CBC    Component Value Date/Time   WBC 7.1 02/28/2023 1108   RBC 5.09 02/28/2023 1108   HGB 14.2 02/28/2023 1108   HCT 43.1 02/28/2023 1108   PLT 287 02/28/2023 1108   MCV 84.7 02/28/2023 1108   MCH 27.9 02/28/2023 1108   MCHC 32.9 02/28/2023 1108   RDW 12.9 02/28/2023 1108    Hgb A1C Lab Results  Component Value Date   HGBA1C 5.9 (H) 02/28/2023  Assessment and Plan:  RTC in 5 months for your annual exam  Follow Up Instructions:    I discussed the assessment and treatment plan with the patient. The patient was provided an opportunity to ask questions and all were answered. The patient agreed with the plan and demonstrated an understanding of the instructions.   The patient was advised to call back or seek an in-person evaluation if the symptoms worsen or if the condition fails to improve as anticipated.    Nicki Reaper, NP

## 2023-03-23 NOTE — Assessment & Plan Note (Signed)
Uncontrolled on olmesartan 20 mg Will increase to 40 mg daily, Rx sent to pharmacy Reinforced DASH diet and exercise for weight loss

## 2023-03-23 NOTE — Assessment & Plan Note (Signed)
Encouraged diet and exercise for weight loss ?

## 2023-03-24 NOTE — Telephone Encounter (Signed)
Requested medication (s) are due for refill today: Yes  Requested medication (s) are on the active medication list: Yes  Last refill:  10/26/22  Future visit scheduled: No  Notes to clinic:  Not delegated.    Requested Prescriptions  Pending Prescriptions Disp Refills   methocarbamol (ROBAXIN) 500 MG tablet [Pharmacy Med Name: METHOCARBAMOL 500 MG TABLET] 90 tablet 0    Sig: TAKE 1 TABLET BY MOUTH EVERY 8 HOURS AS NEEDED FOR MUSCLE SPASMS     Not Delegated - Analgesics:  Muscle Relaxants Failed - 03/23/2023  2:48 PM      Failed - This refill cannot be delegated      Passed - Valid encounter within last 6 months    Recent Outpatient Visits           Yesterday Primary hypertension   [place] [facility] [place], Salvadore Oxford, NP   3 weeks ago Prediabetes   [place] [facility] [place], Salvadore Oxford, NP   8 months ago Primary hypertension   [place] [facility] [place], Salvadore Oxford, NP   9 months ago Fibromyalgia   [place] [facility] [place], Salvadore Oxford, NP   1 year ago Primary hypertension   [place] [facility] [place], [place] W, NP               ibuprofen (ADVIL) 600 MG tablet [Pharmacy Med Name: IBUPROFEN 600 MG TABLET] 90 tablet 0    Sig: TAKE 1 TABLET BY MOUTH EVERY 8 HOURS AS NEEDED     Analgesics:  NSAIDS Failed - 03/23/2023  2:48 PM      Failed - Manual Review: Labs are only required if the patient has taken medication for more than 8 weeks.      Failed - Cr in normal range and within 360 days    Creat  Date Value Ref Range Status  02/28/2023 1.18 (H) 0.50 - 0.99 mg/dL Final         Passed - HGB in normal range and within 360 days    Hemoglobin  Date Value Ref Range Status  02/28/2023 14.2 11.7 - 15.5 g/dL Final         Passed - PLT in normal range and within 360 days    Platelets  Date Value Ref Range Status  02/28/2023 287 140 - 400 Thousand/uL Final         Passed - HCT in  normal range and within 360 days    HCT  Date Value Ref Range Status  02/28/2023 43.1 35.0 - 45.0 % Final         Passed - eGFR is 30 or above and within 360 days    eGFR  Date Value Ref Range Status  02/28/2023 59 (L) > OR = 60 mL/min/1.11m2 Final         Passed - Patient is not pregnant      Passed - Valid encounter within last 12 months    Recent Outpatient Visits           Yesterday Primary hypertension   [place] [facility] [place], Salvadore Oxford, NP   3 weeks ago Prediabetes   [place] [facility] [place], Salvadore Oxford, NP   8 months ago Primary hypertension   [place] [facility] [place], Salvadore Oxford, NP   9 months ago Fibromyalgia   [facility] [place],  Salvadore Oxford, NP   1 year ago Primary hypertension   [place] [facility] [place], Salvadore Oxford, [place]

## 2023-03-29 DIAGNOSIS — F4310 Post-traumatic stress disorder, unspecified: Secondary | ICD-10-CM | POA: Diagnosis not present

## 2023-03-30 ENCOUNTER — Encounter: Payer: Self-pay | Admitting: Internal Medicine

## 2023-03-30 DIAGNOSIS — F909 Attention-deficit hyperactivity disorder, unspecified type: Secondary | ICD-10-CM | POA: Diagnosis not present

## 2023-03-30 DIAGNOSIS — F411 Generalized anxiety disorder: Secondary | ICD-10-CM | POA: Diagnosis not present

## 2023-03-30 DIAGNOSIS — F329 Major depressive disorder, single episode, unspecified: Secondary | ICD-10-CM | POA: Diagnosis not present

## 2023-03-30 DIAGNOSIS — F4310 Post-traumatic stress disorder, unspecified: Secondary | ICD-10-CM | POA: Diagnosis not present

## 2023-03-31 MED ORDER — FELODIPINE ER 5 MG PO TB24: 5.0000 mg | ORAL_TABLET | Freq: Every day | ORAL | 0 refills | Status: DC

## 2023-04-01 ENCOUNTER — Other Ambulatory Visit: Payer: 59

## 2023-04-07 DIAGNOSIS — F4310 Post-traumatic stress disorder, unspecified: Secondary | ICD-10-CM | POA: Diagnosis not present

## 2023-04-11 ENCOUNTER — Ambulatory Visit (INDEPENDENT_AMBULATORY_CARE_PROVIDER_SITE_OTHER): Payer: 59 | Admitting: Family Medicine

## 2023-04-11 ENCOUNTER — Encounter: Payer: Self-pay | Admitting: Family Medicine

## 2023-04-11 VITALS — BP 132/80 | HR 87 | Resp 16 | Ht 62.0 in | Wt 192.6 lb

## 2023-04-11 DIAGNOSIS — R112 Nausea with vomiting, unspecified: Secondary | ICD-10-CM | POA: Diagnosis not present

## 2023-04-11 DIAGNOSIS — K219 Gastro-esophageal reflux disease without esophagitis: Secondary | ICD-10-CM | POA: Diagnosis not present

## 2023-04-11 DIAGNOSIS — Z23 Encounter for immunization: Secondary | ICD-10-CM | POA: Diagnosis not present

## 2023-04-11 DIAGNOSIS — T887XXA Unspecified adverse effect of drug or medicament, initial encounter: Secondary | ICD-10-CM

## 2023-04-11 MED ORDER — ONDANSETRON 4 MG PO TBDP
4.0000 mg | ORAL_TABLET | Freq: Three times a day (TID) | ORAL | 0 refills | Status: DC | PRN
Start: 2023-04-11 — End: 2023-11-23

## 2023-04-11 MED ORDER — OMEPRAZOLE 40 MG PO CPDR
40.0000 mg | DELAYED_RELEASE_CAPSULE | Freq: Every day | ORAL | 2 refills | Status: DC
Start: 2023-04-11 — End: 2023-06-30

## 2023-04-11 NOTE — Progress Notes (Signed)
Subjective:    Patient ID: Sara Andrade, female    DOB: 1981-07-05, 42 y.o.   MRN: [id number]  Sara Andrade is a 42 y.o. female presenting on 04/11/2023 for Headache (Nighttime headaches for 1-2 weeks; begins in neck/shoulders and radiates into head towards ears/temples/) and Anorexia (No desire to eat for about one month; can only eat plain foods in small amounts on rare bursts of hunger)  PCP Nicki Reaper, FNP  Patient presents for a same day appointment.  HPI  GI Symtoms / GERD / Nausea / Vomiting Concern for Medication Side effect  Discussed the use of AI scribe software for clinical note transcription with the patient, who gave verbal consent to proceed.  History of Present Illness   The patient, with a history of agoraphobia and possible prediabetes, presents with a two-month history of loss of appetite, weight loss, and headaches. They report that food has become unappealing, leading to consumption of only small portions and mild foods. They also describe feeling generally unwell and run down over the same period.  In addition to these symptoms, the patient has been experiencing postnasal drip leading to morning coughing and vomiting for approximately 6+ months. Initially, the vomiting was primarily mucus. This typically occurs in the morning upon waking. Also admits some regurgitation or vomit of food now.  The patient also reports experiencing hot and cold chills and headaches at night. They have noticed poor digestion, with undigested food visible in their stools. They experience constant abdominal pain, bloating, and diarrhea, and have a GI consult scheduled to investigate possible IBS or diverticulitis.  Followed by Psychiatry and on several medications for her mental health. The patient is currently on fluvoxamine 100mg , which they have been taking for several years, and recently started on Qelbree for ADHD. They also take omeprazole for heartburn, which they experience  intermittently. They have noticed that their symptoms have worsened since the dosage of Qelbree was increased.     Upcoming GI consultation, on 04/25/23. Possible IBS vs Diverticulitis issues with variety of GI digestive symptoms, abdominal pain, bloating, diarrhea.  Admits headache at night        04/11/2023    2:05 PM 02/28/2023   11:12 AM 07/12/2022    9:39 AM  Depression screen PHQ 2/9  Decreased Interest 2 2 3   Down, Depressed, Hopeless 1 2 1   PHQ - 2 Score 3 4 4   Altered sleeping 3 3 2   Tired, decreased energy 2 3 3   Change in appetite 3 3 1   Feeling bad or failure about yourself  1 2 1   Trouble concentrating 2 3 3   Moving slowly or fidgety/restless 1 3 0  Suicidal thoughts 0 0 0  PHQ-9 Score 15 21 14   Difficult doing work/chores Extremely dIfficult Extremely dIfficult     Social History   Tobacco Use   Smoking status: Former    Current packs/day: 0.00    Average packs/day: 1 pack/day for 3.0 years (3.0 ttl pk-yrs)    Types: Cigarettes    Start date: 2009    Quit date: 2012    Years since quitting: 12.7   Smokeless tobacco: Never  Vaping Use   Vaping status: Never Used  Substance Use Topics   Alcohol use: Yes   Drug use: Never    Review of Systems Per HPI unless specifically indicated above     Objective:    BP 132/80   Pulse 87   Resp 16   Ht 5\' 2"  (1.575 m)  Wt 192 lb 9.6 oz (87.4 kg)   SpO2 97%   BMI 35.23 kg/m   Wt Readings from Last 3 Encounters:  04/11/23 192 lb 9.6 oz (87.4 kg)  02/28/23 201 lb (91.2 kg)  07/12/22 220 lb (99.8 kg)    Physical Exam Vitals and nursing note reviewed.  Constitutional:      General: She is not in acute distress.    Appearance: Normal appearance. She is well-developed. She is not diaphoretic.     Comments: Well-appearing, comfortable, cooperative  HENT:     Head: Normocephalic and atraumatic.  Eyes:     General:        Right eye: No discharge.        Left eye: No discharge.     Conjunctiva/sclera:  Conjunctivae normal.  Cardiovascular:     Rate and Rhythm: Normal rate.  Pulmonary:     Effort: Pulmonary effort is normal.  Skin:    General: Skin is warm and dry.     Findings: No erythema or rash.  Neurological:     Mental Status: She is alert and oriented to person, place, and time.  Psychiatric:        Mood and Affect: Mood normal.        Behavior: Behavior normal.        Thought Content: Thought content normal.     Comments: Well groomed, good eye contact, normal speech and thoughts    Results for orders placed or performed in visit on 02/28/23  CBC  Result Value Ref Range   WBC 7.1 3.8 - 10.8 Thousand/uL   RBC 5.09 3.80 - 5.10 Million/uL   Hemoglobin 14.2 11.7 - 15.5 g/dL   HCT 01.0 27.2 - 53.6 %   MCV 84.7 80.0 - 100.0 fL   MCH 27.9 27.0 - 33.0 pg   MCHC 32.9 32.0 - 36.0 g/dL   RDW 64.4 03.4 - 74.2 %   Platelets 287 140 - 400 Thousand/uL   MPV 10.8 7.5 - 12.5 fL  COMPLETE METABOLIC PANEL WITH GFR  Result Value Ref Range   Glucose, Bld 107 (H) 65 - 99 mg/dL   BUN 15 7 - 25 mg/dL   Creat 5.95 (H) 6.38 - 0.99 mg/dL   eGFR 59 (L) > OR = 60 mL/min/1.90m2   BUN/Creatinine Ratio 13 6 - 22 (calc)   Sodium 140 135 - 146 mmol/L   Potassium 4.6 3.5 - 5.3 mmol/L   Chloride 104 98 - 110 mmol/L   CO2 26 20 - 32 mmol/L   Calcium 9.1 8.6 - 10.2 mg/dL   Total Protein 6.5 6.1 - 8.1 g/dL   Albumin 4.3 3.6 - 5.1 g/dL   Globulin 2.2 1.9 - 3.7 g/dL (calc)   AG Ratio 2.0 1.0 - 2.5 (calc)   Total Bilirubin 0.2 0.2 - 1.2 mg/dL   Alkaline phosphatase (APISO) 81 31 - 125 U/L   AST 16 10 - 30 U/L   ALT 20 6 - 29 U/L  Lipid panel  Result Value Ref Range   Cholesterol 199 <200 mg/dL   HDL 66 > OR = 50 mg/dL   Triglycerides 756 <433 mg/dL   LDL Cholesterol (Calc) 110 (H) mg/dL (calc)   Total CHOL/HDL Ratio 3.0 <5.0 (calc)   Non-HDL Cholesterol (Calc) 133 (H) <130 mg/dL (calc)  Hemoglobin I9J  Result Value Ref Range   Hgb A1c MFr Bld 5.9 (H) <5.7 % of total Hgb   Mean Plasma  Glucose 123 mg/dL  eAG (mmol/L) 6.8 mmol/L      Assessment & Plan:   Problem List Items Addressed This Visit     GERD (gastroesophageal reflux disease) - Primary   Relevant Medications   ondansetron (ZOFRAN-ODT) 4 MG disintegrating tablet   omeprazole (PRILOSEC) 40 MG capsule   Other Visit Diagnoses     Need for influenza vaccination       Relevant Orders   Flu vaccine trivalent PF, 6mos and older(Flulaval,Afluria,Fluarix,Fluzone) (Completed)   Medication side effect       Nausea and vomiting, unspecified vomiting type       Relevant Medications   ondansetron (ZOFRAN-ODT) 4 MG disintegrating tablet       Orders Placed This Encounter  Procedures   Flu vaccine trivalent PF, 6mos and older(Flulaval,Afluria,Fluarix,Fluzone)   Assessment and Plan    Constellation of Gastrointestinal Symptoms Loss of appetite, regurgitation, and poor digestion with possible IBS or diverticulitis. Symptoms have been present for approximately two months. Patient also reports chronic diarrhea, gas, bloating, and pain. Patient is scheduled for a GI consult already coming up  Consideration for possible Gastroparesis  History is chronic and persistent. Not acute.  She is on OTC 20mg  Omeprazole daily, given various dyspepsia and nausea vomiting symptoms, will dose increase now to rx dose. Increase Omeprazole to 40mg  daily Rx Zofran ODT for nausea AS NEEDED  -Continue small, frequent, mild meals to avoid provoking stomach acid and regurgitation. -Keep GI appointment for further evaluation.  Postnasal Drip Chronic postnasal drip leading to morning cough and vomiting, present for approximately eight months. Patient also reports sinus problems. -Continue use of Flonase for congestion. -Consider allergy medications to reduce postnasal drip.  Headaches and Chills Nightly headaches and chills, present for approximately two months. Suspect related to GI disturbances and nutrition -Continue current  management and monitor for changes.  Medication Side Effects Possible side effects from fluvoxamine and Qelbree, including loss of appetite, nausea, and digestive issues. Patient has been on fluvoxamine for several years and Vaughan Basta is a more recent addition.  Concern with dose increases and polypharmacy per Psychiatry, we reviewed side effect profile and these were the 2 most significant profiles that match her symptoms -Discuss potential side effects and medication changes with mental health provider.  -Follow up with primary care provider after GI consult.         Meds ordered this encounter  Medications   ondansetron (ZOFRAN-ODT) 4 MG disintegrating tablet    Sig: Take 1 tablet (4 mg total) by mouth every 8 (eight) hours as needed for nausea or vomiting.    Dispense:  30 tablet    Refill:  0   omeprazole (PRILOSEC) 40 MG capsule    Sig: Take 1 capsule (40 mg total) by mouth daily before breakfast.    Dispense:  30 capsule    Refill:  2      Follow up plan: Return if symptoms worsen or fail to improve.   Saralyn Pilar, DO [facility] 04/11/2023, 3:05 PM

## 2023-04-11 NOTE — Patient Instructions (Addendum)
Thank you for coming to the office today.  Please chat with your mental health provider about side effects on Fluovxamine + Qelbree and they can review the others.  -----------  I am not sure the exact cause of your GI symptoms, however I am concerned that one significant possibility could be uncontrolled Acid Reflux (GERD) and may have developed an Ulcer (Peptic Ulcer of stomach).  Additionally the GI consult will discuss further testing and diagnostics and dosing of this medication.  Start with dose inc Omeprazole at 40mg  dose, take daily before breakfast. For 8+ weeks, and then consider     DIET RECOMMENDATIONS - Avoid spicy, greasy, fried foods, also things like caffeine, dark chocolate, peppermint can worsen - Avoid large meals and late night snacks, also do not go more than 4-5 hours without a snack or meal (not eating will worsen reflux symptoms due to stomach acid) - You may also elevate the head of your bed at night to sleep at very slight incline to help reduce symptoms   If the problem improves but keeps coming back, we can discuss higher dose or longer course at next visit.   If symptoms are worsening or persistent despite treatment or develop any different severe esophagus or abdominal pain, unable to swallow solids or liquids, nausea, vomiting especially blood in vomit, fever/chills, or unintentional weight loss / no appetite, please follow-up sooner in office or seek more immediate medical attention at hospital Emergency Department.  Regarding other medicines:   - STOP taking Ibuprofen, Advil, Motrin, Goody's / BC powder - DO NOT take without discussing with your doctor. These medicines can put you at high risk for future bleeding.  If need pain medicine, may take Tylenol Extra Strength (Acetaminophen) 500mg  tabs - take 1 to 2 tabs per dose (max 1000mg ) every 6-8 hours for pain (take regularly, don't skip a dose for next 7 days), max 24 hour daily dose is 6 tablets or 3000mg .  In the future you can repeat the same everyday Tylenol course for 1-2 weeks at a time.    Please schedule a Follow-up Appointment to: No follow-ups on file.  If you have any other questions or concerns, please feel free to call the office or send a message through MyChart. You may also schedule an earlier appointment if necessary.  Additionally, you may be receiving a survey about your experience at our office within a few days to 1 week by e-mail or mail. We value your feedback.  Saralyn Pilar, DO [facility] [place]

## 2023-04-12 DIAGNOSIS — F4310 Post-traumatic stress disorder, unspecified: Secondary | ICD-10-CM | POA: Diagnosis not present

## 2023-04-18 ENCOUNTER — Other Ambulatory Visit: Payer: 59

## 2023-04-19 DIAGNOSIS — F4310 Post-traumatic stress disorder, unspecified: Secondary | ICD-10-CM | POA: Diagnosis not present

## 2023-04-20 ENCOUNTER — Ambulatory Visit: Payer: 59 | Admitting: Internal Medicine

## 2023-04-22 ENCOUNTER — Ambulatory Visit
Admission: RE | Admit: 2023-04-22 | Discharge: 2023-04-22 | Disposition: A | Discharge status: Home / Self Care | Payer: 59 | Source: Ambulatory Visit | Attending: Family Medicine | Admitting: Family Medicine

## 2023-04-22 DIAGNOSIS — M25551 Pain in right hip: Secondary | ICD-10-CM

## 2023-04-22 DIAGNOSIS — M25451 Effusion, right hip: Secondary | ICD-10-CM | POA: Diagnosis not present

## 2023-04-22 DIAGNOSIS — R936 Abnormal findings on diagnostic imaging of limbs: Secondary | ICD-10-CM | POA: Diagnosis not present

## 2023-04-23 ENCOUNTER — Other Ambulatory Visit: Payer: Self-pay | Admitting: Internal Medicine

## 2023-04-24 DIAGNOSIS — F4310 Post-traumatic stress disorder, unspecified: Secondary | ICD-10-CM | POA: Diagnosis not present

## 2023-04-24 NOTE — Telephone Encounter (Signed)
Requested Prescriptions  Pending Prescriptions Disp Refills   felodipine (PLENDIL) 5 MG 24 hr tablet [Pharmacy Med Name: FELODIPINE ER 5 MG TABLET] 90 tablet 1    Sig: TAKE 1 TABLET (5 MG TOTAL) BY MOUTH DAILY.     Cardiovascular: Calcium Channel Blockers 2 Passed - 04/23/2023 12:30 PM      Passed - Last BP in normal range    BP Readings from Last 1 Encounters:  04/11/23 132/80         Passed - Last Heart Rate in normal range    Pulse Readings from Last 1 Encounters:  04/11/23 87         Passed - Valid encounter within last 6 months    Recent Outpatient Visits           1 week ago Gastroesophageal reflux disease without esophagitis   [place] [facility] [place], Netta Neat, DO   1 month ago Primary hypertension   [place] [facility] [place], Salvadore Oxford, NP   1 month ago Prediabetes   [place] [facility] [place], Salvadore Oxford, NP   9 months ago Primary hypertension   [place] [facility] [place], Salvadore Oxford, NP   10 months ago Fibromyalgia   [place] [facility] [place], Salvadore Oxford, NP       Future Appointments             In 4 weeks Sampson Si, Salvadore Oxford, NP [place] [facility]

## 2023-04-24 NOTE — Progress Notes (Unsigned)
Celso Amy, PA-C [address]  [address]  [place], [place] [address]  Main: [phone]  Fax: [phone]   Primary Care Physician: Lorre Munroe, NP  Primary Gastroenterologist:  Celso Amy, PA-C / Dr. Tobi Bastos  CC: Diarrhea, Constipation  HPI: Sara Andrade is a 42 y.o. female presents for GERD, diarrhea, IBS, loss of appetite, regurgitation, poor digestion for several months.  Has history of GERD.   She tried omeprazole 20 Mg daily which did not control her GERD.  She saw her PCP 04/07/2023 who increased omeprazole to 40 mg daily.  Labs 02/28/2023: Normal CBC, hemoglobin 14.2.  Normal CMP except elevated creatinine 1.18, GFR 59.  Normal LFTs.  No previous H. pylori test.  No previous EGD.  She last saw Dr. Tobi Bastos in our office in 2022 to evaluate chronic constipation and rectal bleeding.  Treated for IBS-C.  She was Scheduled for a colonoscopy and she canceled procedure.  Medical history significant for endometriosis, mitral valve prolapse, and chronic back pain.  Abdominal pelvic CT 12/2020 showed no acute abnormality.  Treated with a gallon of GoLytely for constipation.  Family history unknown.  She is adopted.  Current Outpatient Medications  Medication Sig Dispense Refill   clonazePAM (KLONOPIN) 0.5 MG tablet Take 0.5 mg by mouth daily as needed.     diazepam (VALIUM) 5 MG tablet Take 5 mg by mouth every 6 (six) hours as needed.     felodipine (PLENDIL) 5 MG 24 hr tablet TAKE 1 TABLET (5 MG TOTAL) BY MOUTH DAILY. 90 tablet 1   fluvoxaMINE (LUVOX) 100 MG tablet Take 100 mg by mouth at bedtime.     gabapentin (NEURONTIN) 300 MG capsule Take 300 mg by mouth 3 (three) times daily.     ibuprofen (ADVIL) 600 MG tablet TAKE 1 TABLET BY MOUTH EVERY 8 HOURS AS NEEDED 90 tablet 0   lamoTRIgine (LAMICTAL) 100 MG tablet Take 200 mg by mouth daily.     lovastatin (MEVACOR) 10 MG tablet TAKE 1 TABLET BY MOUTH EVERYDAY AT BEDTIME 90 tablet 0   methocarbamol (ROBAXIN) 500 MG  tablet TAKE 1 TABLET BY MOUTH EVERY 8 HOURS AS NEEDED FOR MUSCLE SPASMS 90 tablet 0   olmesartan (BENICAR) 40 MG tablet Take 1 tablet (40 mg total) by mouth daily. 90 tablet 0   omeprazole (PRILOSEC) 40 MG capsule Take 1 capsule (40 mg total) by mouth daily before breakfast. 30 capsule 2   ondansetron (ZOFRAN-ODT) 4 MG disintegrating tablet Take 1 tablet (4 mg total) by mouth every 8 (eight) hours as needed for nausea or vomiting. 30 tablet 0   polyethylene glycol-electrolytes (NULYTELY) 420 g solution Take 4,000 mLs by mouth once for 1 dose. Use as directed for your colonoscopy preparation 4000 mL 0   prazosin (MINIPRESS) 5 MG capsule Take 5 mg by mouth at bedtime.     QELBREE 200 MG 24 hr capsule Take 400 mg by mouth daily.     REXULTI 2 MG TABS tablet Take 2 mg by mouth daily.     No current facility-administered medications for this visit.    Allergies as of 04/25/2023 - Review Complete 04/25/2023  Allergen Reaction Noted   Bee venom Rash 01/21/2021   Escitalopram Other (See Comments) 09/22/2021   Lactose Diarrhea 06/04/2015   Prednisone  12/25/2020   Sertraline Diarrhea 08/25/2015   Lisinopril Cough and Other (See Comments) 05/22/2015    Past Medical History:  Diagnosis Date   Anxiety    Depression  GERD (gastroesophageal reflux disease)    Hypertension     Past Surgical History:  Procedure Laterality Date   ABDOMINAL HYSTERECTOMY      Review of Systems:    All systems reviewed and negative except where noted in HPI.   Physical Examination:   BP 122/77   Pulse 90   Temp 97.7 F (36.5 C)   Ht 5\' 2"  (1.575 m)   Wt 196 lb (88.9 kg)   BMI 35.85 kg/m   General: Well-nourished, well-developed in no acute distress.  Lungs: Clear to auscultation bilaterally. Non-labored. Heart: Regular rate and rhythm, no murmurs rubs or gallops.  Abdomen: Bowel sounds are normal; Abdomen is Soft; No hepatosplenomegaly, masses or hernias;  No Abdominal Tenderness; No guarding or  rebound tenderness. Neuro: Alert and oriented x 3.  Grossly intact.  Psych: Alert and cooperative, normal mood and affect.   Imaging Studies: No results found.  Assessment and Plan:   Sara Andrade is a 42 y.o. y/o female presents for:  1.  GERD, nausea and vomiting, epigastric pain  Hold omeprazole for 2 weeks and then check H. pylori stool test.  After completing H. pylori test, then restart omeprazole 40 Mg daily.  Anti-GERD diet given.  Scheduling EGD I discussed risks of EGD with patient to include risk of bleeding, perforation, and risk of sedation.  Patient expressed understanding and agrees to proceed with EGD.   2.  Irritable bowel syndrome  Low FODMAP diet given.  3.  Abdominal pain  Ordering abdominal x-ray to evaluate for constipation.  If constipated, then give GoLytely colon purge, then start MiraLAX or Linzess.  4.  Diarrhea / Constipation: Evaluate for overflow diarrhea from constipation  5.  Rectal bleeding  Stool test fecal calprotectin  Scheduling Colonoscopy I discussed risks of colonoscopy with patient to include risk of bleeding, colon perforation, and risk of sedation.  Patient expressed understanding and agrees to proceed with colonoscopy.    Celso Amy, PA-C  Follow up 4 weeks after EGD and colonoscopy with TG.

## 2023-04-25 ENCOUNTER — Ambulatory Visit
Admission: RE | Admit: 2023-04-25 | Discharge: 2023-04-25 | Disposition: A | Discharge status: Home / Self Care | Payer: 59 | Attending: Physician Assistant | Admitting: Physician Assistant

## 2023-04-25 ENCOUNTER — Encounter: Payer: Self-pay | Admitting: Physician Assistant

## 2023-04-25 ENCOUNTER — Ambulatory Visit
Admission: RE | Admit: 2023-04-25 | Discharge: 2023-04-25 | Disposition: A | Discharge status: Home / Self Care | Payer: 59 | Source: Ambulatory Visit | Attending: Physician Assistant | Admitting: Physician Assistant

## 2023-04-25 ENCOUNTER — Ambulatory Visit: Payer: 59 | Admitting: Physician Assistant

## 2023-04-25 VITALS — BP 122/77 | HR 90 | Temp 97.7°F | Ht 62.0 in | Wt 196.0 lb

## 2023-04-25 DIAGNOSIS — K625 Hemorrhage of anus and rectum: Secondary | ICD-10-CM | POA: Diagnosis not present

## 2023-04-25 DIAGNOSIS — R112 Nausea with vomiting, unspecified: Secondary | ICD-10-CM

## 2023-04-25 DIAGNOSIS — K5900 Constipation, unspecified: Secondary | ICD-10-CM | POA: Insufficient documentation

## 2023-04-25 DIAGNOSIS — R197 Diarrhea, unspecified: Secondary | ICD-10-CM | POA: Diagnosis not present

## 2023-04-25 DIAGNOSIS — R1013 Epigastric pain: Secondary | ICD-10-CM | POA: Diagnosis not present

## 2023-04-25 DIAGNOSIS — K582 Mixed irritable bowel syndrome: Secondary | ICD-10-CM | POA: Diagnosis not present

## 2023-04-25 DIAGNOSIS — K219 Gastro-esophageal reflux disease without esophagitis: Secondary | ICD-10-CM | POA: Diagnosis not present

## 2023-04-25 DIAGNOSIS — R1084 Generalized abdominal pain: Secondary | ICD-10-CM

## 2023-04-25 MED ORDER — PEG 3350-KCL-NA BICARB-NACL 420 G PO SOLR: 4000.0000 mL | Freq: Once | ORAL | 0 refills | Status: AC

## 2023-04-25 NOTE — Patient Instructions (Signed)
Please go directly to the medical mall entrance of [facility] to have your xray done today. We will call you with the results.  Please bring your samples back to the office after 2 weeks of stopping the Omeprazole.

## 2023-04-26 ENCOUNTER — Other Ambulatory Visit: Payer: Self-pay | Admitting: Physician Assistant

## 2023-04-26 ENCOUNTER — Encounter: Payer: Self-pay | Admitting: Internal Medicine

## 2023-04-26 DIAGNOSIS — K580 Irritable bowel syndrome with diarrhea: Secondary | ICD-10-CM

## 2023-04-26 MED ORDER — DICYCLOMINE HCL 10 MG PO CAPS
10.0000 mg | ORAL_CAPSULE | Freq: Three times a day (TID) | ORAL | 2 refills | Status: DC
Start: 2023-04-26 — End: 2023-07-31

## 2023-04-26 NOTE — Progress Notes (Signed)
Hi Dani, your abdominal x-ray shows average stool burden and no bowel obstruction.  There is no evidence of significant constipation.  I recommend we try prescription called dicyclomine 10 Mg 1 tablet 3 times daily to help with diarrhea and lower abdominal cramping.  If diarrhea persists, then we will need to order more stool studies to check for infections which can cause diarrhea.  I sent prescription for dicyclomine to your pharmacy.  Please let me know if diarrhea is not improving. Celso Amy, PA-C

## 2023-04-27 ENCOUNTER — Other Ambulatory Visit: Payer: Self-pay | Admitting: Internal Medicine

## 2023-04-27 DIAGNOSIS — E782 Mixed hyperlipidemia: Secondary | ICD-10-CM

## 2023-04-27 NOTE — Telephone Encounter (Signed)
Requested Prescriptions  Pending Prescriptions Disp Refills   lovastatin (MEVACOR) 10 MG tablet [Pharmacy Med Name: LOVASTATIN 10 MG TABLET] 30 tablet 2    Sig: TAKE 1 TABLET BY MOUTH EVERYDAY AT BEDTIME     Cardiovascular:  Antilipid - Statins 2 Failed - 04/27/2023  1:30 AM      Failed - Cr in normal range and within 360 days    Creat  Date Value Ref Range Status  02/28/2023 1.18 (H) 0.50 - 0.99 mg/dL Final         Failed - Lipid Panel in normal range within the last 12 months    Cholesterol  Date Value Ref Range Status  02/28/2023 199 <200 mg/dL Final   LDL Cholesterol (Calc)  Date Value Ref Range Status  02/28/2023 110 (H) mg/dL (calc) Final    Comment:    Reference range: <100 . Desirable range <100 mg/dL for primary prevention;   <70 mg/dL for patients with CHD or diabetic patients  with > or = 2 CHD risk factors. Marland Kitchen LDL-C is now calculated using the Martin-Hopkins  calculation, which is a validated novel method providing  better accuracy than the Friedewald equation in the  estimation of LDL-C.  Horald Pollen et al. Lenox Ahr. 4098;119(14): 2061-2068  (http://education.[employer].com/faq/FAQ164)    HDL  Date Value Ref Range Status  02/28/2023 66 > OR = 50 mg/dL Final   Triglycerides  Date Value Ref Range Status  02/28/2023 121 <150 mg/dL Final         Passed - Patient is not pregnant      Passed - Valid encounter within last 12 months    Recent Outpatient Visits           2 weeks ago Gastroesophageal reflux disease without esophagitis   [place] [facility] [place], Netta Neat, DO   1 month ago Primary hypertension   [place] [facility] [place], Salvadore Oxford, NP   1 month ago Prediabetes   [place] [facility] [place], Salvadore Oxford, NP   9 months ago Primary hypertension   [place] [facility] [place], Salvadore Oxford, NP   10 months ago Fibromyalgia   [facility]  [place], Salvadore Oxford, NP       Future Appointments             In 3 weeks Sampson Si, Salvadore Oxford, NP [facility], [facility]

## 2023-04-28 ENCOUNTER — Encounter: Payer: Self-pay | Admitting: Internal Medicine

## 2023-04-28 ENCOUNTER — Telehealth (INDEPENDENT_AMBULATORY_CARE_PROVIDER_SITE_OTHER): Payer: 59 | Admitting: Internal Medicine

## 2023-04-28 ENCOUNTER — Other Ambulatory Visit: Payer: Self-pay | Admitting: Family Medicine

## 2023-04-28 DIAGNOSIS — F5104 Psychophysiologic insomnia: Secondary | ICD-10-CM

## 2023-04-28 DIAGNOSIS — F419 Anxiety disorder, unspecified: Secondary | ICD-10-CM

## 2023-04-28 DIAGNOSIS — G8929 Other chronic pain: Secondary | ICD-10-CM | POA: Diagnosis not present

## 2023-04-28 DIAGNOSIS — G4700 Insomnia, unspecified: Secondary | ICD-10-CM

## 2023-04-28 DIAGNOSIS — Z0289 Encounter for other administrative examinations: Secondary | ICD-10-CM

## 2023-04-28 DIAGNOSIS — M797 Fibromyalgia: Secondary | ICD-10-CM | POA: Diagnosis not present

## 2023-04-28 DIAGNOSIS — F32A Depression, unspecified: Secondary | ICD-10-CM | POA: Diagnosis not present

## 2023-04-28 DIAGNOSIS — F900 Attention-deficit hyperactivity disorder, predominantly inattentive type: Secondary | ICD-10-CM

## 2023-04-28 NOTE — Progress Notes (Signed)
Virtual Visit via Video Note  I connected with Sara Andrade on 04/28/23 at 11:20 AM EDT by a video enabled telemedicine application and verified that I am speaking with the correct person using two identifiers.  Location: Patient: Home Provider: Office  Persons participating in this video call: Nicki Reaper, NP and Sara Andrade   I discussed the limitations of evaluation and management by telemedicine and the availability of in person appointments. The patient expressed understanding and agreed to proceed.  History of Present Illness:  Patient requesting form completion.  She has a document from her lawyer for Social Security disability requesting information about her chronic health problems, mainly focusing on fibromyalgia, anxiety depression and ADHD.  These have been lifelong issues for her.  She reports she has lived independently in the past but relied heavily on her parents for guidance, financial security.  She has been in a stable relationship since she has been my patient in the last 2 years.  She has been unable to work due to inattention, difficulty concentrating, difficulty with memory, difficulty leaving the house, difficulty working with others, persistent anxiety, panic attacks and depression.  She has persistent insomnia and night terrors.  She is medicated with clonazepam, fluvoxamine, lamotrigine, methocarbamol, prazosin, qelbree and rexulti.  She follows with psychiatry.    Past Medical History:  Diagnosis Date   Anxiety    Depression    GERD (gastroesophageal reflux disease)    Hypertension     Current Outpatient Medications  Medication Sig Dispense Refill   clonazePAM (KLONOPIN) 0.5 MG tablet Take 0.5 mg by mouth daily as needed.     diazepam (VALIUM) 5 MG tablet Take 5 mg by mouth every 6 (six) hours as needed.     dicyclomine (BENTYL) 10 MG capsule Take 1 capsule (10 mg total) by mouth 3 (three) times daily before meals. 90 capsule 2   felodipine (PLENDIL) 5  MG 24 hr tablet TAKE 1 TABLET (5 MG TOTAL) BY MOUTH DAILY. 90 tablet 1   fluvoxaMINE (LUVOX) 100 MG tablet Take 100 mg by mouth at bedtime.     gabapentin (NEURONTIN) 300 MG capsule Take 300 mg by mouth 3 (three) times daily.     ibuprofen (ADVIL) 600 MG tablet TAKE 1 TABLET BY MOUTH EVERY 8 HOURS AS NEEDED 90 tablet 0   lamoTRIgine (LAMICTAL) 100 MG tablet Take 200 mg by mouth daily.     lovastatin (MEVACOR) 10 MG tablet TAKE 1 TABLET BY MOUTH EVERYDAY AT BEDTIME 90 tablet 3   methocarbamol (ROBAXIN) 500 MG tablet TAKE 1 TABLET BY MOUTH EVERY 8 HOURS AS NEEDED FOR MUSCLE SPASMS 90 tablet 0   olmesartan (BENICAR) 40 MG tablet Take 1 tablet (40 mg total) by mouth daily. 90 tablet 0   omeprazole (PRILOSEC) 40 MG capsule Take 1 capsule (40 mg total) by mouth daily before breakfast. 30 capsule 2   ondansetron (ZOFRAN-ODT) 4 MG disintegrating tablet Take 1 tablet (4 mg total) by mouth every 8 (eight) hours as needed for nausea or vomiting. 30 tablet 0   prazosin (MINIPRESS) 5 MG capsule Take 5 mg by mouth at bedtime.     QELBREE 200 MG 24 hr capsule Take 400 mg by mouth daily.     REXULTI 2 MG TABS tablet Take 2 mg by mouth daily.     No current facility-administered medications for this visit.    Allergies  Allergen Reactions   Bee Venom Rash   Escitalopram Other (See Comments)   Lactose Diarrhea  Gas    Prednisone     Other reaction(s): Unknown   Sertraline Diarrhea    Other reaction(s): Other (See Comments), Other (See Comments) Emotional numbness Emotional numbness    Lisinopril Cough and Other (See Comments)    cough cough     Family History  Adopted: Yes    Social History   Socioeconomic History   Marital status: Married    Spouse name: Not on file   Number of children: Not on file   Years of education: Not on file   Highest education level: Associate degree: occupational, Scientist, product/process development, or vocational program  Occupational History   Not on file  Tobacco Use    Smoking status: Former    Current packs/day: 0.00    Average packs/day: 1 pack/day for 3.0 years (3.0 ttl pk-yrs)    Types: Cigarettes    Start date: 2009    Quit date: 2012    Years since quitting: 12.7   Smokeless tobacco: Never  Vaping Use   Vaping status: Never Used  Substance and Sexual Activity   Alcohol use: Yes   Drug use: Never   Sexual activity: Not on file  Other Topics Concern   Not on file  Social History Narrative   Not on file   Social Determinants of Health   Financial Resource Strain: Patient Declined (12/27/2022)   Overall Financial Resource Strain (CARDIA)    Difficulty of Paying Living Expenses: Patient declined  Food Insecurity: Patient Declined (12/27/2022)   Hunger Vital Sign    Worried About Running Out of Food in the Last Year: Patient declined    Ran Out of Food in the Last Year: Patient declined  Transportation Needs: Patient Declined (12/27/2022)   PRAPARE - Administrator, Civil Service (Medical): Patient declined    Lack of Transportation (Non-Medical): Patient declined  Physical Activity: Unknown (12/27/2022)   Exercise Vital Sign    Days of Exercise per Week: Patient declined    Minutes of Exercise per Session: Not on file  Stress: Stress Concern Present (12/27/2022)   [employer] of Occupational Health - Occupational Stress Questionnaire    Feeling of Stress : Very much  Social Connections: Unknown (12/27/2022)   Social Connection and Isolation Panel [NHANES]    Frequency of Communication with Friends and Family: Patient declined    Frequency of Social Gatherings with Friends and Family: Patient declined    Attends Religious Services: Patient declined    Database administrator or Organizations: Patient declined    Attends Banker Meetings: Not on file    Marital Status: Married  Catering manager Violence: Not on file     Constitutional: Patient reports chronic fatigue, unintentional weight gain.  Denies fever,  malaise, headache.  HEENT: Denies eye pain, eye redness, ear pain, ringing in the ears, wax buildup, runny nose, nasal congestion, bloody nose, or sore throat. Respiratory: Denies difficulty breathing, shortness of breath, cough or sputum production.   Cardiovascular: Denies chest pain, chest tightness, palpitations or swelling in the hands or feet.  Gastrointestinal: Patient reports intermittent diarrhea.  Denies abdominal pain, bloating, constipation, or blood in the stool.  GU: Patient reports urinary frequency.  Denies urgency, pain with urination, burning sensation, blood in urine, odor or discharge. Musculoskeletal: Patient reports chronic joint, muscle pain, decrease in range of motion, difficulty with gait.  Denies joint swelling.  Skin: Denies redness, rashes, lesions or ulcercations.  Neurological: Patient reports inattention, insomnia, memory, numbness and tingling in her  lower extremities.  Denies dizziness, difficulty with speech or problems with balance and coordination.  Psych: Has a history of anxiety and depression.  Denies SI/HI.  No other specific complaints in a complete review of systems (except as listed in HPI above).  Observations/Objective:   Wt Readings from Last 3 Encounters:  04/25/23 196 lb (88.9 kg)  04/11/23 192 lb 9.6 oz (87.4 kg)  02/28/23 201 lb (91.2 kg)    General: Appears her stated age, obese, in NAD. Pulmonary/Chest: Normal effort. No respiratory distress.  Neurological: Alert and oriented. Coordination normal.  Psychiatric: Mood and affect normal.  She is anxious appearing. Judgment and thought content normal.     BMET    Component Value Date/Time   NA 140 02/28/2023 1108   K 4.6 02/28/2023 1108   CL 104 02/28/2023 1108   CO2 26 02/28/2023 1108   GLUCOSE 107 (H) 02/28/2023 1108   BUN 15 02/28/2023 1108   CREATININE 1.18 (H) 02/28/2023 1108   CALCIUM 9.1 02/28/2023 1108    Lipid Panel     Component Value Date/Time   CHOL 199  02/28/2023 1108   TRIG 121 02/28/2023 1108   HDL 66 02/28/2023 1108   CHOLHDL 3.0 02/28/2023 1108   LDLCALC 110 (H) 02/28/2023 1108    CBC    Component Value Date/Time   WBC 7.1 02/28/2023 1108   RBC 5.09 02/28/2023 1108   HGB 14.2 02/28/2023 1108   HCT 43.1 02/28/2023 1108   PLT 287 02/28/2023 1108   MCV 84.7 02/28/2023 1108   MCH 27.9 02/28/2023 1108   MCHC 32.9 02/28/2023 1108   RDW 12.9 02/28/2023 1108    Hgb A1C Lab Results  Component Value Date   HGBA1C 5.9 (H) 02/28/2023       Assessment and Plan:  Fibromyalgia, chronic joint pain, anxiety and depression, insomnia:  Disability forms filled out with patient's help and will be placed upfront for patient to pick up Continue current medication regimen at this time  RTC in 4 months for annual exam  Follow Up Instructions:    I discussed the assessment and treatment plan with the patient. The patient was provided an opportunity to ask questions and all were answered. The patient agreed with the plan and demonstrated an understanding of the instructions.   The patient was advised to call back or seek an in-person evaluation if the symptoms worsen or if the condition fails to improve as anticipated.    Nicki Reaper, NP

## 2023-04-28 NOTE — Telephone Encounter (Signed)
Requested medication (s) are due for refill today: Yes  Requested medication (s) are on the active medication list: Yes  Last refill:  03/24/23  Future visit scheduled: Yes  Notes to clinic:  Manual review.    Requested Prescriptions  Pending Prescriptions Disp Refills   ibuprofen (ADVIL) 600 MG tablet [Pharmacy Med Name: IBUPROFEN 600 MG TABLET] 90 tablet 0    Sig: TAKE 1 TABLET BY MOUTH EVERY 8 HOURS AS NEEDED     Analgesics:  NSAIDS Failed - 04/28/2023  1:23 AM      Failed - Manual Review: Labs are only required if the patient has taken medication for more than 8 weeks.      Failed - Cr in normal range and within 360 days    Creat  Date Value Ref Range Status  02/28/2023 1.18 (H) 0.50 - 0.99 mg/dL Final         Passed - HGB in normal range and within 360 days    Hemoglobin  Date Value Ref Range Status  02/28/2023 14.2 11.7 - 15.5 g/dL Final         Passed - PLT in normal range and within 360 days    Platelets  Date Value Ref Range Status  02/28/2023 287 140 - 400 Thousand/uL Final         Passed - HCT in normal range and within 360 days    HCT  Date Value Ref Range Status  02/28/2023 43.1 35.0 - 45.0 % Final         Passed - eGFR is 30 or above and within 360 days    eGFR  Date Value Ref Range Status  02/28/2023 59 (L) > OR = 60 mL/min/1.14m2 Final         Passed - Patient is not pregnant      Passed - Valid encounter within last 12 months    Recent Outpatient Visits           2 weeks ago Gastroesophageal reflux disease without esophagitis   [facility] [place], Netta Neat, DO   1 month ago Primary hypertension   [place] [facility] [place], Salvadore Oxford, NP   1 month ago Prediabetes   [facility] [place], Salvadore Oxford, NP   9 months ago Primary hypertension   [place] [facility] [place], Salvadore Oxford, NP   10 months ago Fibromyalgia   [place] [facility] [place], Salvadore Oxford, NP       Future Appointments             Today Sampson Si, Salvadore Oxford, NP [place] [facility]   In 3 weeks [place], Salvadore Oxford, NP [facility], [facility]

## 2023-05-03 DIAGNOSIS — F4310 Post-traumatic stress disorder, unspecified: Secondary | ICD-10-CM | POA: Diagnosis not present

## 2023-05-04 ENCOUNTER — Encounter: Payer: Self-pay | Admitting: Internal Medicine

## 2023-05-04 ENCOUNTER — Telehealth (INDEPENDENT_AMBULATORY_CARE_PROVIDER_SITE_OTHER): Payer: 59 | Admitting: Internal Medicine

## 2023-05-04 DIAGNOSIS — W010XXA Fall on same level from slipping, tripping and stumbling without subsequent striking against object, initial encounter: Secondary | ICD-10-CM | POA: Diagnosis not present

## 2023-05-04 DIAGNOSIS — S7002XA Contusion of left hip, initial encounter: Secondary | ICD-10-CM

## 2023-05-04 DIAGNOSIS — S5002XA Contusion of left elbow, initial encounter: Secondary | ICD-10-CM

## 2023-05-04 DIAGNOSIS — S5011XA Contusion of right forearm, initial encounter: Secondary | ICD-10-CM | POA: Diagnosis not present

## 2023-05-04 NOTE — Patient Instructions (Signed)
Hematoma A hematoma is a collection of blood under the skin, in an organ, in a body space, in a joint space, or in other tissue. The blood can thicken (clot) to form a lump that you can see and feel. The lump is often firm and may become sore and tender. Most hematomas get better in a few days to weeks. However, some hematomas may be serious and require medical care. Hematomas can range from very small to very large. What are the causes? This condition is caused by: A blunt or penetrating injury. Leakage from a blood vessel under the skin. Some medical procedures, including surgeries, such as oral surgery, face lifts, and surgeries on the joints. Some medical conditions that cause bleeding or bruising. There may be multiple hematomas that appear in different areas of the body. What increases the risk? You are more likely to develop this condition if: You are an older adult. You use blood thinners. You regularly use NSAIDs, such as ibuprofen, for pain. You play contact sports. What are the signs or symptoms?  Symptoms of this condition depend on where the hematoma is located.  Common symptoms of a hematoma that is under the skin include: A firm lump on the body. Pain and tenderness in the area. Bruising. Blue, dark blue, purple-red, or yellowish skin (discoloration) may appear at the site of the hematoma if the hematoma is close to the surface of the skin. Common symptoms of a hematoma that is deep in the tissues or body spaces may be less obvious. They include: A collection of blood in the stomach (intra-abdominal hematoma). This may cause pain in the abdomen, weakness, fainting, and shortness of breath. A collection of blood in the head (intracranial hematoma). This may cause a headache or symptoms such as weakness, trouble speaking or understanding, or a change in consciousness. How is this diagnosed? This condition is diagnosed based on: Your medical history. A physical exam. Imaging  tests, such as an ultrasound or CT scan. These may be needed if your health care provider suspects a hematoma in deeper tissues or body spaces. Blood tests. These may be needed if your health care provider believes that the hematoma is caused by a medical condition. How is this treated? Treatment for this condition depends on the cause, size, and location of the hematoma. Treatment may include: Doing nothing. The majority of hematomas do not need treatment as many of them go away on their own. Surgery or close monitoring. This may be needed for large hematomas or hematomas that affect vital organs. Medicines. Medicines may be given if there is an underlying medical cause for the hematoma. Follow these instructions at home: Managing pain, stiffness, and swelling  If directed, put ice on the injured area. To do this: Put ice in a plastic bag. Place a towel between your skin and the bag. Leave the ice on for 20 minutes, 2-3 times a day for the first couple of days. If your skin turns bright red, remove the ice right away to prevent skin damage. The risk of skin damage is higher if you cannot feel pain, heat, or cold. If directed, apply heat to the affected area as often as told by your health care provider. Use the heat source that your health care provider recommends, such as a moist heat pack or a heating pad. Place a towel between your skin and the heat source. Leave the heat on for 20-30 minutes. If your skin turns bright red, remove the heat right  away to prevent burns. The risk of burns is higher if you cannot feel pain, heat, or cold. Raise (elevate) the injured area above the level of your heart while you are sitting or lying down. If directed, wrap the affected area with an elastic bandage. The bandage applies pressure (compression) to the area, which may help to reduce swelling and promote healing. Do not wrap the bandage too tightly around the affected area. If your hematoma is on a leg  or foot (lower extremity) and is painful, your health care provider may recommend crutches. Use them as told by your health care provider. General instructions Take over-the-counter and prescription medicines only as told by your health care provider. Rest the injured area as directed by your health care provider. Keep all follow-up visits. Your health care provider may want to see how your hematoma is progressing with treatment. Contact a health care provider if: You have a fever. The swelling or discoloration gets worse. You develop more hematomas. Your pain is worse or your pain is not controlled with medicine. Your skin over the hematoma breaks or starts bleeding. Get help right away if: Your hematoma is in your chest or abdomen and you have weakness, shortness of breath, or a change in consciousness. You have a hematoma on your scalp that is caused by a fall or injury, and you also have: A headache that gets worse. Trouble speaking or understanding speech. Weakness. A change in alertness or consciousness. These symptoms may be an emergency. Get help right away. Call 911. Do not wait to see if the symptoms will go away. Do not drive yourself to the hospital. This information is not intended to replace advice given to you by your health care provider. Make sure you discuss any questions you have with your health care provider. Document Revised: 12/27/2021 Document Reviewed: 12/27/2021 Elsevier Patient Education  2024 [employer].

## 2023-05-04 NOTE — Progress Notes (Signed)
Virtual Visit via Video Note  I connected with Sara Andrade on 05/04/23 at 11:20 AM EDT by a video enabled telemedicine application and verified that I am speaking with the correct person using two identifiers.  Location: Patient: Home Provider: Office  Persons participating in this video call: Nicki Reaper, NP and Sara Andrade   I discussed the limitations of evaluation and management by telemedicine and the availability of in person appointments. The patient expressed understanding and agreed to proceed.  History of Present Illness:  Patient reports a fall.  This occurred 4 days ago. She reports she was woken up by a noise outside, she was running through the house, fell over the dog gate. She landed on her belly. She reports a bruise to the left elbow, right arm and left hip. She denies decrease in range of motion.  She does not feel like the bruised areas are inflamed or warm to touch.  She did not hit her head or lose consciousness.  She has tried ibuprofen with minimal relief of symptoms. She has a history of chronic joint pain and fibromyalgia.   Past Medical History:  Diagnosis Date   Anxiety    Depression    GERD (gastroesophageal reflux disease)    Hypertension     Current Outpatient Medications  Medication Sig Dispense Refill   clonazePAM (KLONOPIN) 0.5 MG tablet Take 0.5 mg by mouth daily as needed.     diazepam (VALIUM) 5 MG tablet Take 5 mg by mouth every 6 (six) hours as needed.     dicyclomine (BENTYL) 10 MG capsule Take 1 capsule (10 mg total) by mouth 3 (three) times daily before meals. 90 capsule 2   felodipine (PLENDIL) 5 MG 24 hr tablet TAKE 1 TABLET (5 MG TOTAL) BY MOUTH DAILY. 90 tablet 1   fluvoxaMINE (LUVOX) 100 MG tablet Take 100 mg by mouth at bedtime.     gabapentin (NEURONTIN) 300 MG capsule Take 300 mg by mouth 3 (three) times daily.     ibuprofen (ADVIL) 600 MG tablet TAKE 1 TABLET BY MOUTH EVERY 8 HOURS AS NEEDED 90 tablet 0   lamoTRIgine  (LAMICTAL) 100 MG tablet Take 200 mg by mouth daily.     lovastatin (MEVACOR) 10 MG tablet TAKE 1 TABLET BY MOUTH EVERYDAY AT BEDTIME 90 tablet 3   methocarbamol (ROBAXIN) 500 MG tablet TAKE 1 TABLET BY MOUTH EVERY 8 HOURS AS NEEDED FOR MUSCLE SPASMS 90 tablet 0   olmesartan (BENICAR) 40 MG tablet Take 1 tablet (40 mg total) by mouth daily. 90 tablet 0   omeprazole (PRILOSEC) 40 MG capsule Take 1 capsule (40 mg total) by mouth daily before breakfast. 30 capsule 2   ondansetron (ZOFRAN-ODT) 4 MG disintegrating tablet Take 1 tablet (4 mg total) by mouth every 8 (eight) hours as needed for nausea or vomiting. 30 tablet 0   prazosin (MINIPRESS) 5 MG capsule Take 5 mg by mouth at bedtime.     QELBREE 200 MG 24 hr capsule Take 400 mg by mouth daily.     REXULTI 2 MG TABS tablet Take 2 mg by mouth daily.     No current facility-administered medications for this visit.    Allergies  Allergen Reactions   Bee Venom Rash   Escitalopram Other (See Comments)   Lactose Diarrhea    Gas    Prednisone     Other reaction(s): Unknown   Sertraline Diarrhea    Other reaction(s): Other (See Comments), Other (See Comments) Emotional numbness Emotional  numbness    Lisinopril Cough and Other (See Comments)    cough cough     Family History  Adopted: Yes    Social History   Socioeconomic History   Marital status: Married    Spouse name: Not on file   Number of children: Not on file   Years of education: Not on file   Highest education level: Associate degree: occupational, Scientist, product/process development, or vocational program  Occupational History   Not on file  Tobacco Use   Smoking status: Former    Current packs/day: 0.00    Average packs/day: 1 pack/day for 3.0 years (3.0 ttl pk-yrs)    Types: Cigarettes    Start date: 2009    Quit date: 2012    Years since quitting: 12.8   Smokeless tobacco: Never  Vaping Use   Vaping status: Never Used  Substance and Sexual Activity   Alcohol use: Yes   Drug use:  Never   Sexual activity: Not on file  Other Topics Concern   Not on file  Social History Narrative   Not on file   Social Determinants of Health   Financial Resource Strain: Patient Declined (12/27/2022)   Overall Financial Resource Strain (CARDIA)    Difficulty of Paying Living Expenses: Patient declined  Food Insecurity: Patient Declined (12/27/2022)   Hunger Vital Sign    Worried About Running Out of Food in the Last Year: Patient declined    Ran Out of Food in the Last Year: Patient declined  Transportation Needs: Patient Declined (12/27/2022)   PRAPARE - Administrator, Civil Service (Medical): Patient declined    Lack of Transportation (Non-Medical): Patient declined  Physical Activity: Unknown (12/27/2022)   Exercise Vital Sign    Days of Exercise per Week: Patient declined    Minutes of Exercise per Session: Not on file  Stress: Stress Concern Present (12/27/2022)   [employer] of Occupational Health - Occupational Stress Questionnaire    Feeling of Stress : Very much  Social Connections: Unknown (12/27/2022)   Social Connection and Isolation Panel [NHANES]    Frequency of Communication with Friends and Family: Patient declined    Frequency of Social Gatherings with Friends and Family: Patient declined    Attends Religious Services: Patient declined    Database administrator or Organizations: Patient declined    Attends Engineer, structural: Not on file    Marital Status: Married  Catering manager Violence: Not on file     Constitutional: Denies fever, malaise, fatigue, headache or abrupt weight changes.  Respiratory: Denies difficulty breathing, shortness of breath, cough or sputum production.   Cardiovascular: Denies chest pain, chest tightness, palpitations or swelling in the hands or feet.  Musculoskeletal: Patient reports chronic joint and muscle pain.  Denies decrease in range of motion, difficulty with gait, or joint swelling.  Skin:  Patient reports bruising to right forearm, left elbow and left hip.  Denies redness, rashes, lesions or ulcercations.    No other specific complaints in a complete review of systems (except as listed in HPI above).    Observations/Objective:   Wt Readings from Last 3 Encounters:  04/25/23 196 lb (88.9 kg)  04/11/23 192 lb 9.6 oz (87.4 kg)  02/28/23 201 lb (91.2 kg)    General: Appears her stated age, obese, in NAD. Skin: Bruising noted to right forearm, left elbow and left hip. Pulmonary/Chest: Normal effort. No respiratory distress.  Musculoskeletal: Normal flexion, extension and rotation of the right elbow  and the right wrist.  Normal flexion, extension of left elbow.  No difficulty with gait. Neurological: Alert and oriented.    BMET    Component Value Date/Time   NA 140 02/28/2023 1108   K 4.6 02/28/2023 1108   CL 104 02/28/2023 1108   CO2 26 02/28/2023 1108   GLUCOSE 107 (H) 02/28/2023 1108   BUN 15 02/28/2023 1108   CREATININE 1.18 (H) 02/28/2023 1108   CALCIUM 9.1 02/28/2023 1108    Lipid Panel     Component Value Date/Time   CHOL 199 02/28/2023 1108   TRIG 121 02/28/2023 1108   HDL 66 02/28/2023 1108   CHOLHDL 3.0 02/28/2023 1108   LDLCALC 110 (H) 02/28/2023 1108    CBC    Component Value Date/Time   WBC 7.1 02/28/2023 1108   RBC 5.09 02/28/2023 1108   HGB 14.2 02/28/2023 1108   HCT 43.1 02/28/2023 1108   PLT 287 02/28/2023 1108   MCV 84.7 02/28/2023 1108   MCH 27.9 02/28/2023 1108   MCHC 32.9 02/28/2023 1108   RDW 12.9 02/28/2023 1108    Hgb A1C Lab Results  Component Value Date   HGBA1C 5.9 (H) 02/28/2023       Assessment and Plan:  Hematoma right forearm, hematoma left elbow, hematoma left hip secondary to fall due to stumbling:  She does not feel like anything is broken, no need for imaging at this time Advised her that hematomas typically last longer than bruises but do improve in a matter of weeks to months. Recommend alternating  heat and ice Okay to take Tylenol OTC as needed  RTC in 4 months for annual exam  Follow Up Instructions:    I discussed the assessment and treatment plan with the patient. The patient was provided an opportunity to ask questions and all were answered. The patient agreed with the plan and demonstrated an understanding of the instructions.   The patient was advised to call back or seek an in-person evaluation if the symptoms worsen or if the condition fails to improve as anticipated.    Nicki Reaper, NP

## 2023-05-08 ENCOUNTER — Telehealth: Payer: Self-pay

## 2023-05-08 NOTE — Telephone Encounter (Signed)
Left detailed message on VM letting patient know its ok to drop stool samples off at [facility] in [place] before 4 pm.

## 2023-05-08 NOTE — Telephone Encounter (Signed)
Patient Spouse Toni Amend chase left a message at 1:44pm  stool samples and is wanting to know if she can drop off samples in [place] to the [facility] and what she needs to do with the samples after collecting.  Toni Amend is on patient DPR form.

## 2023-05-11 DIAGNOSIS — F4310 Post-traumatic stress disorder, unspecified: Secondary | ICD-10-CM | POA: Diagnosis not present

## 2023-05-16 ENCOUNTER — Telehealth: Payer: Self-pay | Admitting: Gastroenterology

## 2023-05-16 NOTE — Telephone Encounter (Signed)
Patient wife Toni Amend) called and left a voicemail they are having trouble getting the simple for lab work. I called patient back to let him know we received his message, and no one answer was unable to leave a voicemail called the wife phone. The patient wife wanted to know if they can buy something over the counter. Just in case her food menu doesn't work.

## 2023-05-17 ENCOUNTER — Telehealth: Payer: Self-pay

## 2023-05-17 NOTE — Telephone Encounter (Signed)
Spoke with courtney and Colace was suggested to try to have bowel movement- we do not want watery stool for the testing- also stated she would hopefully have specimens to [facility] within a few days and wants to have H-Pylori at [facility] due to work. lease call patient and wife to clarify message.  I previously ordered an H. pylori stool test after patient had been off PPI omeprazole for 2 weeks.  I also ordered fecal calprotectin stool test.  If patient is not able to do the H. pylori stool test, then I recommend schedule H. pylori breath test here in the office (off PPI omeprazole). Celso Amy, PA-C      Patient wife Toni Amend) called and left a voicemail they are having trouble getting the simple for lab work. I called patient back to let him know we received his message, and no one answer was unable to leave a voicemail called the wife phone. The patient wife wanted to know if they can buy something over the counter. Just in case her food menu doesn't

## 2023-05-18 DIAGNOSIS — F909 Attention-deficit hyperactivity disorder, unspecified type: Secondary | ICD-10-CM | POA: Diagnosis not present

## 2023-05-18 DIAGNOSIS — F411 Generalized anxiety disorder: Secondary | ICD-10-CM | POA: Diagnosis not present

## 2023-05-18 DIAGNOSIS — F4310 Post-traumatic stress disorder, unspecified: Secondary | ICD-10-CM | POA: Diagnosis not present

## 2023-05-18 DIAGNOSIS — F329 Major depressive disorder, single episode, unspecified: Secondary | ICD-10-CM | POA: Diagnosis not present

## 2023-05-22 ENCOUNTER — Encounter: Payer: Self-pay | Admitting: Internal Medicine

## 2023-05-22 ENCOUNTER — Ambulatory Visit: Payer: 59 | Admitting: Dermatology

## 2023-05-22 ENCOUNTER — Ambulatory Visit: Payer: 59 | Admitting: Internal Medicine

## 2023-05-22 NOTE — Progress Notes (Deleted)
Subjective:    Patient ID: Sara Andrade, female    DOB: 05-11-1981, 42 y.o.   MRN: [id number]  HPI    Review of Systems     Past Medical History:  Diagnosis Date   Anxiety    Depression    GERD (gastroesophageal reflux disease)    Hypertension     Current Outpatient Medications  Medication Sig Dispense Refill   clonazePAM (KLONOPIN) 0.5 MG tablet Take 0.5 mg by mouth daily as needed.     diazepam (VALIUM) 5 MG tablet Take 5 mg by mouth every 6 (six) hours as needed.     dicyclomine (BENTYL) 10 MG capsule Take 1 capsule (10 mg total) by mouth 3 (three) times daily before meals. 90 capsule 2   felodipine (PLENDIL) 5 MG 24 hr tablet TAKE 1 TABLET (5 MG TOTAL) BY MOUTH DAILY. 90 tablet 1   fluvoxaMINE (LUVOX) 100 MG tablet Take 100 mg by mouth at bedtime.     gabapentin (NEURONTIN) 300 MG capsule Take 300 mg by mouth 3 (three) times daily.     ibuprofen (ADVIL) 600 MG tablet TAKE 1 TABLET BY MOUTH EVERY 8 HOURS AS NEEDED 90 tablet 0   lamoTRIgine (LAMICTAL) 100 MG tablet Take 200 mg by mouth daily.     lovastatin (MEVACOR) 10 MG tablet TAKE 1 TABLET BY MOUTH EVERYDAY AT BEDTIME 90 tablet 3   methocarbamol (ROBAXIN) 500 MG tablet TAKE 1 TABLET BY MOUTH EVERY 8 HOURS AS NEEDED FOR MUSCLE SPASMS 90 tablet 0   olmesartan (BENICAR) 40 MG tablet Take 1 tablet (40 mg total) by mouth daily. 90 tablet 0   omeprazole (PRILOSEC) 40 MG capsule Take 1 capsule (40 mg total) by mouth daily before breakfast. 30 capsule 2   ondansetron (ZOFRAN-ODT) 4 MG disintegrating tablet Take 1 tablet (4 mg total) by mouth every 8 (eight) hours as needed for nausea or vomiting. 30 tablet 0   prazosin (MINIPRESS) 5 MG capsule Take 5 mg by mouth at bedtime.     QELBREE 200 MG 24 hr capsule Take 400 mg by mouth daily.     REXULTI 2 MG TABS tablet Take 2 mg by mouth daily.     No current facility-administered medications for this visit.    Allergies  Allergen Reactions   Bee Venom Rash   Escitalopram Other  (See Comments)   Lactose Diarrhea    Gas    Prednisone     Other reaction(s): Unknown   Sertraline Diarrhea    Other reaction(s): Other (See Comments), Other (See Comments) Emotional numbness Emotional numbness    Lisinopril Cough and Other (See Comments)    cough cough     Family History  Adopted: Yes    Social History   Socioeconomic History   Marital status: Married    Spouse name: Not on file   Number of children: Not on file   Years of education: Not on file   Highest education level: Associate degree: occupational, Scientist, product/process development, or vocational program  Occupational History   Not on file  Tobacco Use   Smoking status: Former    Current packs/day: 0.00    Average packs/day: 1 pack/day for 3.0 years (3.0 ttl pk-yrs)    Types: Cigarettes    Start date: 2009    Quit date: 2012    Years since quitting: 12.8   Smokeless tobacco: Never  Vaping Use   Vaping status: Never Used  Substance and Sexual Activity   Alcohol use: Yes  Drug use: Never   Sexual activity: Not on file  Other Topics Concern   Not on file  Social History Narrative   Not on file   Social Determinants of Health   Financial Resource Strain: High Risk (05/21/2023)   Overall Financial Resource Strain (CARDIA)    Difficulty of Paying Living Expenses: Hard  Food Insecurity: No Food Insecurity (05/21/2023)   Hunger Vital Sign    Worried About Running Out of Food in the Last Year: Never true    Ran Out of Food in the Last Year: Never true  Transportation Needs: Unknown (05/21/2023)   PRAPARE - Transportation    Lack of Transportation (Medical): No    Lack of Transportation (Non-Medical): Patient declined  Physical Activity: Unknown (05/21/2023)   Exercise Vital Sign    Days of Exercise per Week: 0 days    Minutes of Exercise per Session: Not on file  Stress: Stress Concern Present (05/21/2023)   [employer] of Occupational Health - Occupational Stress Questionnaire    Feeling of Stress : Very  much  Social Connections: Socially Isolated (05/21/2023)   Social Connection and Isolation Panel [NHANES]    Frequency of Communication with Friends and Family: Never    Frequency of Social Gatherings with Friends and Family: Never    Attends Religious Services: Never    Database administrator or Organizations: No    Attends Engineer, structural: Not on file    Marital Status: Married  Catering manager Violence: Not on file     Constitutional: Denies fever, malaise, fatigue, headache or abrupt weight changes.  HEENT: Denies eye pain, eye redness, ear pain, ringing in the ears, wax buildup, runny nose, nasal congestion, bloody nose, or sore throat. Respiratory: Denies difficulty breathing, shortness of breath, cough or sputum production.   Cardiovascular: Denies chest pain, chest tightness, palpitations or swelling in the hands or feet.  Gastrointestinal: Patient reports intermittent diarrhea.  Denies abdominal pain, bloating, constipation, or blood in the stool.  GU: Denies urgency, frequency, pain with urination, burning sensation, blood in urine, odor or discharge. Musculoskeletal: Patient reports chronic joint and muscle pain.  Denies decrease in range of motion, difficulty with gait, or joint swelling.  Skin: Denies redness, rashes, lesions or ulcercations.  Neurological: Patient reports insomnia, inattention.  Denies dizziness, difficulty with memory, difficulty with speech or problems with balance and coordination.  Psych: Patient has a history of anxiety and depression.  Denies SI/HI.  No other specific complaints in a complete review of systems (except as listed in HPI above).  Objective:   Physical Exam   There were no vitals taken for this visit. Wt Readings from Last 3 Encounters:  04/25/23 196 lb (88.9 kg)  04/11/23 192 lb 9.6 oz (87.4 kg)  02/28/23 201 lb (91.2 kg)    General: Appears their stated age, well developed, well nourished in NAD. Skin: Warm, dry  and intact. No rashes, lesions or ulcerations noted. HEENT: Head: normal shape and size; Eyes: sclera white, no icterus, conjunctiva pink, PERRLA and EOMs intact; Ears: Tm's gray and intact, normal light reflex; Nose: mucosa pink and moist, septum midline; Throat/Mouth: Teeth present, mucosa pink and moist, no exudate, lesions or ulcerations noted.  Neck:  Neck supple, trachea midline. No masses, lumps or thyromegaly present.  Cardiovascular: Normal rate and rhythm. S1,S2 noted.  No murmur, rubs or gallops noted. No JVD or BLE edema. No carotid bruits noted. Pulmonary/Chest: Normal effort and positive vesicular breath sounds. No respiratory distress.  No wheezes, rales or ronchi noted.  Abdomen: Soft and nontender. Normal bowel sounds. No distention or masses noted. Liver, spleen and kidneys non palpable. Musculoskeletal: Normal range of motion. No signs of joint swelling. No difficulty with gait.  Neurological: Alert and oriented. Cranial nerves II-XII grossly intact. Coordination normal.  Psychiatric: Mood and affect normal. Behavior is normal. Judgment and thought content normal.    BMET    Component Value Date/Time   NA 140 02/28/2023 1108   K 4.6 02/28/2023 1108   CL 104 02/28/2023 1108   CO2 26 02/28/2023 1108   GLUCOSE 107 (H) 02/28/2023 1108   BUN 15 02/28/2023 1108   CREATININE 1.18 (H) 02/28/2023 1108   CALCIUM 9.1 02/28/2023 1108    Lipid Panel     Component Value Date/Time   CHOL 199 02/28/2023 1108   TRIG 121 02/28/2023 1108   HDL 66 02/28/2023 1108   CHOLHDL 3.0 02/28/2023 1108   LDLCALC 110 (H) 02/28/2023 1108    CBC    Component Value Date/Time   WBC 7.1 02/28/2023 1108   RBC 5.09 02/28/2023 1108   HGB 14.2 02/28/2023 1108   HCT 43.1 02/28/2023 1108   PLT 287 02/28/2023 1108   MCV 84.7 02/28/2023 1108   MCH 27.9 02/28/2023 1108   MCHC 32.9 02/28/2023 1108   RDW 12.9 02/28/2023 1108    Hgb A1C Lab Results  Component Value Date   HGBA1C 5.9 (H)  02/28/2023           Assessment & Plan:    RTC in 3 months for your annual exam Nicki Reaper, NP

## 2023-05-25 ENCOUNTER — Encounter: Payer: Self-pay | Admitting: Gastroenterology

## 2023-05-25 DIAGNOSIS — F4310 Post-traumatic stress disorder, unspecified: Secondary | ICD-10-CM | POA: Diagnosis not present

## 2023-05-31 DIAGNOSIS — F4310 Post-traumatic stress disorder, unspecified: Secondary | ICD-10-CM | POA: Diagnosis not present

## 2023-06-01 ENCOUNTER — Ambulatory Visit
Admission: RE | Admit: 2023-06-01 | Discharge: 2023-06-01 | Disposition: A | Discharge status: Home / Self Care | Payer: 59 | Attending: Gastroenterology | Admitting: Gastroenterology

## 2023-06-01 ENCOUNTER — Ambulatory Visit: Payer: 59 | Admitting: Anesthesiology

## 2023-06-01 ENCOUNTER — Encounter: Payer: Self-pay | Admitting: Gastroenterology

## 2023-06-01 ENCOUNTER — Encounter
Admission: RE | Disposition: A | Discharge status: Home / Self Care | Payer: Self-pay | Source: Home / Self Care | Attending: Gastroenterology

## 2023-06-01 DIAGNOSIS — F32A Depression, unspecified: Secondary | ICD-10-CM | POA: Insufficient documentation

## 2023-06-01 DIAGNOSIS — K635 Polyp of colon: Secondary | ICD-10-CM | POA: Insufficient documentation

## 2023-06-01 DIAGNOSIS — K5900 Constipation, unspecified: Secondary | ICD-10-CM | POA: Diagnosis not present

## 2023-06-01 DIAGNOSIS — Z87891 Personal history of nicotine dependence: Secondary | ICD-10-CM | POA: Diagnosis not present

## 2023-06-01 DIAGNOSIS — E66813 Obesity, class 3: Secondary | ICD-10-CM | POA: Diagnosis not present

## 2023-06-01 DIAGNOSIS — D126 Benign neoplasm of colon, unspecified: Secondary | ICD-10-CM

## 2023-06-01 DIAGNOSIS — F419 Anxiety disorder, unspecified: Secondary | ICD-10-CM | POA: Diagnosis not present

## 2023-06-01 DIAGNOSIS — I10 Essential (primary) hypertension: Secondary | ICD-10-CM | POA: Diagnosis not present

## 2023-06-01 DIAGNOSIS — K219 Gastro-esophageal reflux disease without esophagitis: Secondary | ICD-10-CM | POA: Insufficient documentation

## 2023-06-01 DIAGNOSIS — K640 First degree hemorrhoids: Secondary | ICD-10-CM | POA: Insufficient documentation

## 2023-06-01 DIAGNOSIS — K625 Hemorrhage of anus and rectum: Secondary | ICD-10-CM | POA: Diagnosis not present

## 2023-06-01 DIAGNOSIS — Z6836 Body mass index (BMI) 36.0-36.9, adult: Secondary | ICD-10-CM | POA: Diagnosis not present

## 2023-06-01 DIAGNOSIS — D124 Benign neoplasm of descending colon: Secondary | ICD-10-CM | POA: Insufficient documentation

## 2023-06-01 HISTORY — PX: COLONOSCOPY WITH PROPOFOL: SHX5780

## 2023-06-01 HISTORY — PX: POLYPECTOMY: SHX5525

## 2023-06-01 HISTORY — DX: Other complications of anesthesia, initial encounter: T88.59XA

## 2023-06-01 SURGERY — COLONOSCOPY WITH PROPOFOL
Anesthesia: General

## 2023-06-01 MED ORDER — PROPOFOL 10 MG/ML IV BOLUS
INTRAVENOUS | Status: DC | PRN
  Administered 2023-06-01: 40 mg via INTRAVENOUS
  Administered 2023-06-01 (×2): 30 mg via INTRAVENOUS
  Administered 2023-06-01: 20 mg via INTRAVENOUS
  Administered 2023-06-01: 40 mg via INTRAVENOUS
  Administered 2023-06-01 (×2): 20 mg via INTRAVENOUS
  Administered 2023-06-01: 50 mg via INTRAVENOUS

## 2023-06-01 MED ORDER — LIDOCAINE HCL (PF) 2 % IJ SOLN
INTRAMUSCULAR | Status: DC | PRN
  Administered 2023-06-01: 40 mg via INTRADERMAL

## 2023-06-01 MED ORDER — SODIUM CHLORIDE 0.9 % IV SOLN: INTRAVENOUS | Status: DC

## 2023-06-01 NOTE — Anesthesia Preprocedure Evaluation (Signed)
Anesthesia Evaluation  Patient identified by MRN, date of birth, ID band Patient awake    Reviewed: Allergy & Precautions, NPO status , Patient's Chart, lab work & pertinent test results  Airway Mallampati: II  TM Distance: >3 FB Neck ROM: Full    Dental  (+) Teeth Intact   Pulmonary neg pulmonary ROS, Patient abstained from smoking., former smoker   Pulmonary exam normal  + decreased breath sounds      Cardiovascular Exercise Tolerance: Good hypertension, Pt. on medications negative cardio ROS Normal cardiovascular exam Rhythm:Regular Rate:Normal     Neuro/Psych   Anxiety Depression    negative neurological ROS  negative psych ROS   GI/Hepatic negative GI ROS, Neg liver ROS,GERD  Medicated,,  Endo/Other  negative endocrine ROS  Class 3 obesity  Renal/GU negative Renal ROS  negative genitourinary   Musculoskeletal   Abdominal  (+) + obese  Peds negative pediatric ROS (+)  Hematology negative hematology ROS (+)   Anesthesia Other Findings Past Medical History: No date: Anxiety No date: Complication of anesthesia No date: Depression No date: GERD (gastroesophageal reflux disease) No date: Hypertension  Past Surgical History: No date: ABDOMINAL HYSTERECTOMY  BMI    Body Mass Index: 36.00 kg/m      Reproductive/Obstetrics negative OB ROS                             Anesthesia Physical Anesthesia Plan  ASA: 2  Anesthesia Plan: General   Post-op Pain Management:    Induction: Intravenous  PONV Risk Score and Plan: Propofol infusion and TIVA  Airway Management Planned: Natural Airway and Nasal Cannula  Additional Equipment:   Intra-op Plan:   Post-operative Plan:   Informed Consent: I have reviewed the patients History and Physical, chart, labs and discussed the procedure including the risks, benefits and alternatives for the proposed anesthesia with the patient or  authorized representative who has indicated his/her understanding and acceptance.     Dental Advisory Given  Plan Discussed with: CRNA and Surgeon  Anesthesia Plan Comments:        Anesthesia Quick Evaluation

## 2023-06-01 NOTE — Anesthesia Postprocedure Evaluation (Signed)
Anesthesia Post Note  Patient: Sara Andrade  Procedure(s) Performed: COLONOSCOPY WITH PROPOFOL POLYPECTOMY  Patient location during evaluation: PACU Anesthesia Type: General Level of consciousness: awake Pain management: pain level controlled Vital Signs Assessment: post-procedure vital signs reviewed and stable Respiratory status: spontaneous breathing and nonlabored ventilation Cardiovascular status: blood pressure returned to baseline Anesthetic complications: no   No notable events documented.   Last Vitals:  Vitals:   06/01/23 1028 06/01/23 1038  BP: 116/86 129/87  Pulse: 87 88  Resp: 14 14  Temp: (!) 36.3 C (!) 36.3 C  SpO2: 100% 100%    Last Pain:  Vitals:   06/01/23 1038  TempSrc: Temporal  PainSc: 0-No pain                 VAN STAVEREN,Marney Treloar

## 2023-06-01 NOTE — H&P (Signed)
Wyline Mood, MD [address], [place], [place], [address] [address], [place], [place], [address] Phone: [phone]  Fax: [phone]  Primary Care Physician:  Lorre Munroe, NP   Pre-Procedure History & Physical: HPI:  Sara Andrade is a 42 y.o. female is here for an colonoscopy.   Past Medical History:  Diagnosis Date   Anxiety    Complication of anesthesia    Depression    GERD (gastroesophageal reflux disease)    Hypertension     Past Surgical History:  Procedure Laterality Date   ABDOMINAL HYSTERECTOMY      Prior to Admission medications   Medication Sig Start Date End Date Taking? Authorizing Provider  dicyclomine (BENTYL) 10 MG capsule Take 1 capsule (10 mg total) by mouth 3 (three) times daily before meals. 04/26/23 07/25/23 Yes Celso Amy, PA-C  felodipine (PLENDIL) 5 MG 24 hr tablet TAKE 1 TABLET (5 MG TOTAL) BY MOUTH DAILY. 04/24/23  Yes Baity, Salvadore Oxford, NP  gabapentin (NEURONTIN) 300 MG capsule Take 300 mg by mouth 3 (three) times daily. 11/26/21  Yes [provider]  lamoTRIgine (LAMICTAL) 100 MG tablet Take 200 mg by mouth daily.   Yes [provider]  nortriptyline (PAMELOR) 25 MG capsule Take 25 mg by mouth at bedtime.   Yes [provider]  olmesartan (BENICAR) 40 MG tablet Take 1 tablet (40 mg total) by mouth daily. 03/23/23  Yes Lorre Munroe, NP  omeprazole (PRILOSEC) 40 MG capsule Take 1 capsule (40 mg total) by mouth daily before breakfast. 04/11/23  Yes Karamalegos, Alexander J, DO  QELBREE 200 MG 24 hr capsule Take 400 mg by mouth daily. 12/05/22  Yes [provider]  clonazePAM (KLONOPIN) 0.5 MG tablet Take 0.5 mg by mouth daily as needed. 01/14/21   [provider]  diazepam (VALIUM) 5 MG tablet Take 5 mg by mouth every 6 (six) hours as needed. 05/18/22   [provider]  fluvoxaMINE (LUVOX) 100 MG tablet Take 100 mg by mouth at bedtime.    [provider]   ibuprofen (ADVIL) 600 MG tablet TAKE 1 TABLET BY MOUTH EVERY 8 HOURS AS NEEDED 03/24/23   Althea Charon, Netta Neat, DO  lovastatin (MEVACOR) 10 MG tablet TAKE 1 TABLET BY MOUTH EVERYDAY AT BEDTIME 04/27/23   Baity, Salvadore Oxford, NP  methocarbamol (ROBAXIN) 500 MG tablet TAKE 1 TABLET BY MOUTH EVERY 8 HOURS AS NEEDED FOR MUSCLE SPASMS 03/24/23   Karamalegos, Netta Neat, DO  ondansetron (ZOFRAN-ODT) 4 MG disintegrating tablet Take 1 tablet (4 mg total) by mouth every 8 (eight) hours as needed for nausea or vomiting. 04/11/23   Althea Charon, Netta Neat, DO  prazosin (MINIPRESS) 5 MG capsule Take 5 mg by mouth at bedtime. 03/31/22   [provider]  REXULTI 2 MG TABS tablet Take 2 mg by mouth daily. 10/18/21   [provider]    Allergies as of 04/26/2023 - Review Complete 04/25/2023  Allergen Reaction Noted   Bee venom Rash 01/21/2021   Escitalopram Other (See Comments) 09/22/2021   Lactose Diarrhea 06/04/2015   Prednisone  12/25/2020   Sertraline Diarrhea 08/25/2015   Lisinopril Cough and Other (See Comments) 05/22/2015    Family History  Adopted: Yes    Social History   Socioeconomic History   Marital status: Married    Spouse name: Not on file   Number of children: Not on file   Years of education: Not on file   Highest  education level: Associate degree: occupational, Scientist, product/process development, or vocational program  Occupational History   Not on file  Tobacco Use   Smoking status: Former    Current packs/day: 0.00    Average packs/day: 1 pack/day for 3.0 years (3.0 ttl pk-yrs)    Types: Cigarettes    Start date: 2009    Quit date: 2012    Years since quitting: 12.8   Smokeless tobacco: Never  Vaping Use   Vaping status: Never Used  Substance and Sexual Activity   Alcohol use: Yes    Comment: occ   Drug use: Never   Sexual activity: Not on file  Other Topics Concern   Not on file  Social History Narrative   Not on file   Social Determinants of Health   Financial  Resource Strain: High Risk (05/21/2023)   Overall Financial Resource Strain (CARDIA)    Difficulty of Paying Living Expenses: Hard  Food Insecurity: No Food Insecurity (05/21/2023)   Hunger Vital Sign    Worried About Running Out of Food in the Last Year: Never true    Ran Out of Food in the Last Year: Never true  Transportation Needs: Unknown (05/21/2023)   PRAPARE - Transportation    Lack of Transportation (Medical): No    Lack of Transportation (Non-Medical): Patient declined  Physical Activity: Unknown (05/21/2023)   Exercise Vital Sign    Days of Exercise per Week: 0 days    Minutes of Exercise per Session: Not on file  Stress: Stress Concern Present (05/21/2023)   [employer] of Occupational Health - Occupational Stress Questionnaire    Feeling of Stress : Very much  Social Connections: Socially Isolated (05/21/2023)   Social Connection and Isolation Panel [NHANES]    Frequency of Communication with Friends and Family: Never    Frequency of Social Gatherings with Friends and Family: Never    Attends Religious Services: Never    Database administrator or Organizations: No    Attends Engineer, structural: Not on file    Marital Status: Married  Catering manager Violence: Not on file    Review of Systems: See HPI, otherwise negative ROS  Physical Exam: BP (!) 139/100   Pulse 87   Temp (!) 97.4 F (36.3 C) (Temporal)   Resp 16   Wt 89.3 kg   SpO2 97%   BMI 36.00 kg/m  General:   Alert,  pleasant and cooperative in NAD Head:  Normocephalic and atraumatic. Neck:  Supple; no masses or thyromegaly. Lungs:  Clear throughout to auscultation, normal respiratory effort.    Heart:  +S1, +S2, Regular rate and rhythm, No edema. Abdomen:  Soft, nontender and nondistended. Normal bowel sounds, without guarding, and without rebound.   Neurologic:  Alert and  oriented x4;  grossly normal neurologically.  Impression/Plan: Sara Andrade is here for an colonoscopy to be  performed for rectal bleeding.  Risks, benefits, limitations, and alternatives regarding  colonoscopy have been reviewed with the patient.  Questions have been answered.  All parties agreeable.   Wyline Mood, MD  06/01/2023, 9:22 AM

## 2023-06-01 NOTE — Transfer of Care (Signed)
Immediate Anesthesia Transfer of Care Note  Patient: Sara Andrade  Procedure(s) Performed: COLONOSCOPY WITH PROPOFOL POLYPECTOMY  Patient Location: PACU and Endoscopy Unit  Anesthesia Type:MAC  Level of Consciousness: drowsy  Airway & Oxygen Therapy: Patient Spontanous Breathing and Patient connected to nasal cannula oxygen  Post-op Assessment: Report given to RN and Post -op Vital signs reviewed and stable  Post vital signs: Reviewed and stable  Last Vitals:  Vitals Value Taken Time  BP 101/67 06/01/23 1018  Temp 36.3 C 06/01/23 1018  Pulse 96 06/01/23 1018  Resp 11 06/01/23 1018  SpO2 98 % 06/01/23 1018    Last Pain:  Vitals:   06/01/23 1018  TempSrc: Temporal  PainSc: 0-No pain         Complications: No notable events documented.

## 2023-06-01 NOTE — Op Note (Signed)
[facility] Gastroenterology Patient Name: Sara Andrade Procedure Date: 06/01/2023 9:58 AM MRN: [id number] Account #: [id number] Date of Birth: Oct 02, 1980 Admit Type: Outpatient Age: 42 Room: [facility] ENDO ROOM 2 Gender: Female Note Status: Finalized Instrument Name: Prentice Docker [id number] Procedure:             Colonoscopy Indications:           Rectal bleeding Providers:             Wyline Mood MD, MD Referring MD:          Lorre Munroe (Referring MD) Medicines:             Monitored Anesthesia Care Complications:         No immediate complications. Procedure:             Pre-Anesthesia Assessment:                        - Prior to the procedure, a History and Physical was                         performed, and patient medications, allergies and                         sensitivities were reviewed. The patient's tolerance                         of previous anesthesia was reviewed.                        - The risks and benefits of the procedure and the                         sedation options and risks were discussed with the                         patient. All questions were answered and informed                         consent was obtained.                        - ASA Grade Assessment: II - A patient with mild                         systemic disease.                        After obtaining informed consent, the colonoscope was                         passed under direct vision. Throughout the procedure,                         the patient's blood pressure, pulse, and oxygen                         saturations were monitored continuously. The                         Colonoscope was introduced through the anus  and                         advanced to the the cecum, identified by the                         appendiceal orifice. The colonoscopy was performed                         with ease. The patient tolerated the procedure well.                         The  quality of the bowel preparation was good. The                         ileocecal valve, appendiceal orifice, and rectum were                         photographed. Findings:      The perianal and digital rectal examinations were normal.      Two sessile polyps were found in the descending colon and ascending       colon. The polyps were 4 to 5 mm in size. These polyps were removed with       a cold snare. Resection and retrieval were complete.      Non-bleeding internal hemorrhoids were found during retroflexion. The       hemorrhoids were medium-sized and Grade I (internal hemorrhoids that do       not prolapse).      The exam was otherwise without abnormality on direct and retroflexion       views. Impression:            - Two 4 to 5 mm polyps in the descending colon and in                         the ascending colon, removed with a cold snare.                         Resected and retrieved.                        - Non-bleeding internal hemorrhoids.                        - The examination was otherwise normal on direct and                         retroflexion views. Recommendation:        - Discharge patient to home (with escort).                        - Resume previous diet.                        - Continue present medications.                        - Await pathology results.                        - Repeat colonoscopy  for surveillance based on                         pathology results. Procedure Code(s):     --- Professional ---                        [phone], Colonoscopy, flexible; with removal of                         tumor(s), polyp(s), or other lesion(s) by snare                         technique Diagnosis Code(s):     --- Professional ---                        D12.4, Benign neoplasm of descending colon                        D12.2, Benign neoplasm of ascending colon                        K64.0, First degree hemorrhoids                        K62.5, Hemorrhage of anus  and rectum CPT copyright 2022 American Medical Association. All rights reserved. The codes documented in this report are preliminary and upon coder review may  be revised to meet current compliance requirements. Wyline Mood, MD Wyline Mood MD, MD 06/01/2023 10:19:21 AM This report has been signed electronically. Number of Addenda: 0 Note Initiated On: 06/01/2023 9:58 AM Scope Withdrawal Time: 0 hours 8 minutes 51 seconds  Total Procedure Duration: 0 hours 10 minutes 41 seconds  Estimated Blood Loss:  Estimated blood loss: none.      [facility]

## 2023-06-02 ENCOUNTER — Encounter: Payer: Self-pay | Admitting: Gastroenterology

## 2023-06-02 LAB — SURGICAL PATHOLOGY

## 2023-06-06 ENCOUNTER — Institutional Professional Consult (permissible substitution): Payer: 59 | Admitting: Plastic Surgery

## 2023-06-06 ENCOUNTER — Ambulatory Visit (INDEPENDENT_AMBULATORY_CARE_PROVIDER_SITE_OTHER): Payer: 59 | Admitting: Internal Medicine

## 2023-06-06 ENCOUNTER — Encounter: Payer: Self-pay | Admitting: Internal Medicine

## 2023-06-06 VITALS — BP 116/72 | HR 85 | Ht 62.0 in | Wt 196.4 lb

## 2023-06-06 DIAGNOSIS — Z8659 Personal history of other mental and behavioral disorders: Secondary | ICD-10-CM

## 2023-06-06 DIAGNOSIS — F419 Anxiety disorder, unspecified: Secondary | ICD-10-CM | POA: Diagnosis not present

## 2023-06-06 DIAGNOSIS — F4310 Post-traumatic stress disorder, unspecified: Secondary | ICD-10-CM | POA: Diagnosis not present

## 2023-06-06 DIAGNOSIS — Z734 Inadequate social skills, not elsewhere classified: Secondary | ICD-10-CM | POA: Diagnosis not present

## 2023-06-06 DIAGNOSIS — F900 Attention-deficit hyperactivity disorder, predominantly inattentive type: Secondary | ICD-10-CM

## 2023-06-06 DIAGNOSIS — F32A Depression, unspecified: Secondary | ICD-10-CM | POA: Diagnosis not present

## 2023-06-06 MED ORDER — GABAPENTIN 300 MG PO CAPS: 300.0000 mg | ORAL_CAPSULE | Freq: Three times a day (TID) | ORAL | 1 refills | Status: DC

## 2023-06-06 NOTE — Patient Instructions (Signed)
Living With Autism Spectrum Disorder Autism spectrum disorder (ASD) is a group of developmental disorders that start during childhood. They affect how someone communicates, interacts with others, and behaves. ASD affects each person in different ways. It is hard to find treatments that work for all people who have ASD. If you have been diagnosed with ASD, you may be relieved to now know why you have felt different or behaved a certain way. You may also have questions about the treatment ahead, how to get the support you need, and how to deal with ASD from day to day. With treatment and support, you can live a full life with ASD and manage your symptoms. What actions can I take to manage autism spectrum disorder? Manage stress     Stress is your body's reaction to life changes and events, both good and bad. Stress can last for just a few hours. It can also be ongoing. Talk with your health care provider or therapist if you would like to learn more about ways to reduce your stress. Choose a method of lowering stress (stress reduction technique) that fits your lifestyle and personality, such as: Muscle relaxation. To do this, tense your muscles on purpose and then relax them. Keeping a stress diary. This can help you learn what causes your stress to start (figure out your triggers). It can also help you learn how to control your response to those triggers. Adding humor to your life by watching funny films or TV shows. Getting plenty of sleep. Doing things that help you relax, such as exercise, music, or art. Practicing mindfulness, yoga, or deep breathing. Eating a healthy diet.  Take your medicine Your health care provider may prescribe medicine to treat other conditions you have. These conditions may include: Anxiety or depression. Aggression or feeling irritable. Being unable to pay attention (inattention). Being overly active (hyperactivity). Sleep problems. Repeating physical or mental acts  that you feel you have to do (compulsive behavior). Seizures. Talk with your pharmacist or health care provider about: All medicines that you take. The side effects they may cause. Which medicines are safe to take together. Tell your health care provider or pharmacist if your medicines are causing any side effects. Learn social skills Your health care provider may suggest therapy that teaches you social skills. With social skills training, you can: Learn about social cues and how to watch for them. Better understand nonverbal communication, such as facial expressions and body language. Learn the right responses for social situations. Improve your friendships. How to recognize changes in your condition Living with ASD is different for everyone. You and your health care provider will work together to decide next steps in your treatment plan. Talk with your health care provider if you feel that your symptoms are getting worse. Follow these instructions at home: Learn as much as you can about ASD. Make sure you understand it. Follow your treatment plan as told. Make it your goal to take part in all treatment decisions (shared decision-making). Shared decision-making with your health care provider should be part of your total treatment plan. Work closely with your health care providers and family to get the therapies you need. Do not use alcohol and other substances that may keep your medicines from working properly. Take over-the-counter and prescription medicines only as told by your health care provider. Check with your health care provider before taking any new medicines. Keep all follow-up visits. Treatment is more effective when started early. Where to find support Talking  to others Trusted friends and family can offer support and guidance. Reach out to them to explain your condition and how you are feeling. Mention that you are working with your health care provider. Start by telling them  about any of your behaviors that are symptoms of ASD. Your diagnosis could help them understand why you sometimes have a hard time connecting with friends or family. It is important for your family and close friends to learn as much as they can about your ASD. This will help them understand your behavior and help you as needed. Finances There is help for dealing with the costs of living with ASD. Some resources include not-for-profit organizations and [employer]. You should also check with your insurance carrier to learn what ASD treatment is covered by your plan. If you are taking medicines, you may be able to get the generic forms. These may cost less than brand-name medicines. Some makers of prescription medicines also offer help to people who cannot afford the medicines that they need. Therapy and support groups Your health care provider may recommend behavioral, educational, or social skills therapies. These involve working with a Warden/ranger, Child psychotherapist, Chiropractor, or other mental health provider. Therapy may help you reduce how severe your ASD symptoms are. It may also help you manage symptoms of other problems that involve your behavior or emotions. Where to find more information Centers for Disease Control and Prevention Insurance claims handler): FootballExhibition.com.br [employer] of Mental Health: http://www.maynard.net/ World Health Organization: https://castaneda-walker.com/ American Speech-Language-Hearing Association (ASHA): www.asha.org Autism Society: www.autism-society.org Asperger/Autism Network (AANE): www.aane.org Contact a health care provider if: You develop new symptoms. Your symptoms get worse or do not get better with treatment. Get help right away if: You are acting in ways that harm yourself or others. You have thoughts of hurting yourself or others. Get help right away if you feel like you may hurt yourself or others, or have thoughts about taking your own life. Go to your nearest  emergency room or: Call 911. Call the National Suicide Prevention Lifeline at 808-762-3102 or 988. This is open 24 hours a day. Text the Crisis Text Line at 6034299626. Summary You can live a full life with ASD and manage your symptoms. A treatment plan can help you improve your symptoms and manage stress. It may include behavior therapy, social skills training, and medicines. Your health care provider, friends, and family can give you support. There are also many not-for-profit and government resources to help people with ASD. This information is not intended to replace advice given to you by your health care provider. Make sure you discuss any questions you have with your health care provider. Document Revised: 10/14/2021 Document Reviewed: 10/14/2021 Elsevier Patient Education  2024 [employer].

## 2023-06-06 NOTE — Progress Notes (Signed)
Subjective:    Patient ID: Sara Andrade, female    DOB: 06-Feb-1981, 42 y.o.   MRN: [id number]  HPI  Discussed the use of AI scribe software for clinical note transcription with the patient, who gave verbal consent to proceed.  The patient, diagnosed with fibromyalgia, presents for a refill of gabapentin, which they have been taking at a dose of 300mg  three times a day. They report no new symptoms or issues related to their fibromyalgia since their last visit.  In addition to their fibromyalgia, the patient expresses concerns about potential undiagnosed autism. They report struggles with social cues and frequent masking of their symptoms. They also mention having tics and a history of meltdowns during their childhood. The patient has previously sought a diagnosis for autism but has encountered resistance from healthcare professionals who question the value of such a diagnosis in adulthood. Despite this, the patient remains interested in pursuing a diagnosis, believing it may provide answers and context for their experiences.       Review of Systems   Past Medical History:  Diagnosis Date   Anxiety    Complication of anesthesia    Depression    GERD (gastroesophageal reflux disease)    Hypertension     Current Outpatient Medications  Medication Sig Dispense Refill   clonazePAM (KLONOPIN) 0.5 MG tablet Take 0.5 mg by mouth daily as needed.     diazepam (VALIUM) 5 MG tablet Take 5 mg by mouth every 6 (six) hours as needed.     dicyclomine (BENTYL) 10 MG capsule Take 1 capsule (10 mg total) by mouth 3 (three) times daily before meals. 90 capsule 2   felodipine (PLENDIL) 5 MG 24 hr tablet TAKE 1 TABLET (5 MG TOTAL) BY MOUTH DAILY. 90 tablet 1   fluvoxaMINE (LUVOX) 100 MG tablet Take 100 mg by mouth at bedtime.     gabapentin (NEURONTIN) 300 MG capsule Take 300 mg by mouth 3 (three) times daily.     ibuprofen (ADVIL) 600 MG tablet TAKE 1 TABLET BY MOUTH EVERY 8 HOURS AS NEEDED 90  tablet 0   lamoTRIgine (LAMICTAL) 100 MG tablet Take 200 mg by mouth daily.     lovastatin (MEVACOR) 10 MG tablet TAKE 1 TABLET BY MOUTH EVERYDAY AT BEDTIME 90 tablet 3   methocarbamol (ROBAXIN) 500 MG tablet TAKE 1 TABLET BY MOUTH EVERY 8 HOURS AS NEEDED FOR MUSCLE SPASMS 90 tablet 0   nortriptyline (PAMELOR) 25 MG capsule Take 25 mg by mouth at bedtime.     olmesartan (BENICAR) 40 MG tablet Take 1 tablet (40 mg total) by mouth daily. 90 tablet 0   omeprazole (PRILOSEC) 40 MG capsule Take 1 capsule (40 mg total) by mouth daily before breakfast. 30 capsule 2   ondansetron (ZOFRAN-ODT) 4 MG disintegrating tablet Take 1 tablet (4 mg total) by mouth every 8 (eight) hours as needed for nausea or vomiting. 30 tablet 0   prazosin (MINIPRESS) 5 MG capsule Take 5 mg by mouth at bedtime.     QELBREE 200 MG 24 hr capsule Take 400 mg by mouth daily.     REXULTI 2 MG TABS tablet Take 2 mg by mouth daily.     No current facility-administered medications for this visit.    Allergies  Allergen Reactions   Bee Venom Rash   Escitalopram Other (See Comments)   Lactose Diarrhea    Gas    Prednisone     Other reaction(s): Unknown   Sertraline Diarrhea  Other reaction(s): Other (See Comments), Other (See Comments) Emotional numbness Emotional numbness    Lisinopril Cough and Other (See Comments)    cough cough     Family History  Adopted: Yes    Social History   Socioeconomic History   Marital status: Married    Spouse name: Not on file   Number of children: Not on file   Years of education: Not on file   Highest education level: Associate degree: occupational, Scientist, product/process development, or vocational program  Occupational History   Not on file  Tobacco Use   Smoking status: Former    Current packs/day: 0.00    Average packs/day: 1 pack/day for 3.0 years (3.0 ttl pk-yrs)    Types: Cigarettes    Start date: 2009    Quit date: 2012    Years since quitting: 12.8   Smokeless tobacco: Never  Vaping  Use   Vaping status: Never Used  Substance and Sexual Activity   Alcohol use: Yes    Comment: occ   Drug use: Never   Sexual activity: Not on file  Other Topics Concern   Not on file  Social History Narrative   Not on file   Social Determinants of Health   Financial Resource Strain: High Risk (05/21/2023)   Overall Financial Resource Strain (CARDIA)    Difficulty of Paying Living Expenses: Hard  Food Insecurity: No Food Insecurity (05/21/2023)   Hunger Vital Sign    Worried About Running Out of Food in the Last Year: Never true    Ran Out of Food in the Last Year: Never true  Transportation Needs: Unknown (05/21/2023)   PRAPARE - Transportation    Lack of Transportation (Medical): No    Lack of Transportation (Non-Medical): Patient declined  Physical Activity: Unknown (05/21/2023)   Exercise Vital Sign    Days of Exercise per Week: 0 days    Minutes of Exercise per Session: Not on file  Stress: Stress Concern Present (05/21/2023)   [employer] of Occupational Health - Occupational Stress Questionnaire    Feeling of Stress : Very much  Social Connections: Socially Isolated (05/21/2023)   Social Connection and Isolation Panel [NHANES]    Frequency of Communication with Friends and Family: Never    Frequency of Social Gatherings with Friends and Family: Never    Attends Religious Services: Never    Database administrator or Organizations: No    Attends Engineer, structural: Not on file    Marital Status: Married  Catering manager Violence: Not on file     Constitutional: Patient reports chronic fatigue.  Denies fever, malaise, headache or abrupt weight changes.  HEENT: Denies eye pain, eye redness, ear pain, ringing in the ears, wax buildup, runny nose, nasal congestion, bloody nose, or sore throat. Respiratory: Denies difficulty breathing, shortness of breath, cough or sputum production.   Cardiovascular: Denies chest pain, chest tightness, palpitations or  swelling in the hands or feet.  Gastrointestinal: Patient reports intermittent diarrhea.  Denies abdominal pain, bloating, constipation, or blood in the stool.  GU: Denies urgency, frequency, pain with urination, burning sensation, blood in urine, odor or discharge. Musculoskeletal: Patient reports chronic joint and muscle pain.  Denies decrease in range of motion, difficulty with gait, or joint swelling.  Skin: Denies redness, rashes, lesions or ulcercations.  Neurological: Patient reports inattention, insomnia.  Denies dizziness, difficulty with memory, difficulty with speech or problems with balance and coordination.  Psych: Patient has a history of anxiety and depression.  Denies SI/HI.  No other specific complaints in a complete review of systems (except as listed in HPI above).      Objective:   Physical Exam  BP 116/72   Pulse 85   Ht 5\' 2"  (1.575 m)   Wt 196 lb 6.4 oz (89.1 kg)   SpO2 95%   BMI 35.92 kg/m   Wt Readings from Last 3 Encounters:  06/01/23 196 lb 12.8 oz (89.3 kg)  04/25/23 196 lb (88.9 kg)  04/11/23 192 lb 9.6 oz (87.4 kg)    General: Appears her stated age, obese in NAD. Cardiovascular: Normal rate. Pulmonary/Chest: Normal effort and positive vesicular breath sounds. No respiratory distress. No wheezes, rales or ronchi noted.  Musculoskeletal: No difficulty with gait.  Neurological: Alert and oriented.  Coordination normal.  Psychiatric: Mood and affect mildly flat.  Anxious appearing.  Judgment and thought content normal.    BMET    Component Value Date/Time   NA 140 02/28/2023 1108   K 4.6 02/28/2023 1108   CL 104 02/28/2023 1108   CO2 26 02/28/2023 1108   GLUCOSE 107 (H) 02/28/2023 1108   BUN 15 02/28/2023 1108   CREATININE 1.18 (H) 02/28/2023 1108   CALCIUM 9.1 02/28/2023 1108    Lipid Panel     Component Value Date/Time   CHOL 199 02/28/2023 1108   TRIG 121 02/28/2023 1108   HDL 66 02/28/2023 1108   CHOLHDL 3.0 02/28/2023 1108    LDLCALC 110 (H) 02/28/2023 1108    CBC    Component Value Date/Time   WBC 7.1 02/28/2023 1108   RBC 5.09 02/28/2023 1108   HGB 14.2 02/28/2023 1108   HCT 43.1 02/28/2023 1108   PLT 287 02/28/2023 1108   MCV 84.7 02/28/2023 1108   MCH 27.9 02/28/2023 1108   MCHC 32.9 02/28/2023 1108   RDW 12.9 02/28/2023 1108    Hgb A1C Lab Results  Component Value Date   HGBA1C 5.9 (H) 02/28/2023            Assessment & Plan:  Assessment and Plan    Fibromyalgia Stable on Gabapentin 300mg  TID. -Continue Gabapentin 300mg  TID. -Send prescription to [facility].  Possible Autism Spectrum Disorder/Borderline Personality Disorder Patient reports social difficulties, masking, and tics. Previous attempts at diagnosis have been unsuccessful due to reluctance of providers to diagnose autism in adults. -Referral to [facility] in [place] for further evaluation and potential diagnosis.  ADHD Noted as a comorbidity with potential autism. No changes or concerns discussed. -No changes to current management plan.       RTC in 3 months for your annual exam Nicki Reaper, NP

## 2023-06-08 ENCOUNTER — Encounter: Payer: Self-pay | Admitting: Gastroenterology

## 2023-06-08 DIAGNOSIS — F411 Generalized anxiety disorder: Secondary | ICD-10-CM | POA: Diagnosis not present

## 2023-06-08 DIAGNOSIS — F4310 Post-traumatic stress disorder, unspecified: Secondary | ICD-10-CM | POA: Diagnosis not present

## 2023-06-08 DIAGNOSIS — F909 Attention-deficit hyperactivity disorder, unspecified type: Secondary | ICD-10-CM | POA: Diagnosis not present

## 2023-06-08 DIAGNOSIS — F329 Major depressive disorder, single episode, unspecified: Secondary | ICD-10-CM | POA: Diagnosis not present

## 2023-06-14 DIAGNOSIS — F4310 Post-traumatic stress disorder, unspecified: Secondary | ICD-10-CM | POA: Diagnosis not present

## 2023-06-23 DIAGNOSIS — F4310 Post-traumatic stress disorder, unspecified: Secondary | ICD-10-CM | POA: Diagnosis not present

## 2023-06-29 DIAGNOSIS — F4310 Post-traumatic stress disorder, unspecified: Secondary | ICD-10-CM | POA: Diagnosis not present

## 2023-06-30 ENCOUNTER — Other Ambulatory Visit: Payer: Self-pay | Admitting: Family Medicine

## 2023-06-30 DIAGNOSIS — K219 Gastro-esophageal reflux disease without esophagitis: Secondary | ICD-10-CM

## 2023-06-30 NOTE — Telephone Encounter (Signed)
Requested Prescriptions  Pending Prescriptions Disp Refills   omeprazole (PRILOSEC) 40 MG capsule [Pharmacy Med Name: OMEPRAZOLE DR 40 MG CAPSULE] 30 capsule 2    Sig: TAKE 1 CAPSULE BY MOUTH EVERY DAY BEFORE BREAKFAST     Gastroenterology: Proton Pump Inhibitors Passed - 06/30/2023  9:17 AM      Passed - Valid encounter within last 12 months    Recent Outpatient Visits           3 weeks ago Attention deficit hyperactivity disorder (ADHD), predominantly inattentive type   [place] [facility] [place], Salvadore Oxford, NP   1 month ago Hematoma of right forearm   [place] [facility] [place], Salvadore [place], NP   2 months ago Psychophysiological insomnia   [place] [facility] [place], [place] W, NP   2 months ago Gastroesophageal reflux disease without esophagitis   [place] [facility] Smitty Cords, DO   3 months ago Primary hypertension   [facility] [place], Salvadore Oxford, [place]

## 2023-07-03 ENCOUNTER — Ambulatory Visit (INDEPENDENT_AMBULATORY_CARE_PROVIDER_SITE_OTHER): Payer: 59 | Admitting: Physician Assistant

## 2023-07-03 ENCOUNTER — Encounter: Payer: Self-pay | Admitting: Physician Assistant

## 2023-07-03 VITALS — BP 108/76 | HR 86 | Temp 97.9°F | Ht 62.0 in | Wt 195.6 lb

## 2023-07-03 DIAGNOSIS — M797 Fibromyalgia: Secondary | ICD-10-CM | POA: Diagnosis not present

## 2023-07-03 DIAGNOSIS — K582 Mixed irritable bowel syndrome: Secondary | ICD-10-CM

## 2023-07-03 DIAGNOSIS — M76891 Other specified enthesopathies of right lower limb, excluding foot: Secondary | ICD-10-CM | POA: Diagnosis not present

## 2023-07-03 DIAGNOSIS — M25851 Other specified joint disorders, right hip: Secondary | ICD-10-CM | POA: Diagnosis not present

## 2023-07-03 DIAGNOSIS — K219 Gastro-esophageal reflux disease without esophagitis: Secondary | ICD-10-CM | POA: Diagnosis not present

## 2023-07-03 DIAGNOSIS — Z860101 Personal history of adenomatous and serrated colon polyps: Secondary | ICD-10-CM | POA: Diagnosis not present

## 2023-07-03 DIAGNOSIS — M24159 Other articular cartilage disorders, unspecified hip: Secondary | ICD-10-CM | POA: Diagnosis not present

## 2023-07-03 NOTE — Progress Notes (Signed)
Celso Amy, PA-C [address]  [address]  [place], [place] [address]  Main: [phone]  Fax: [phone]   Primary Care Physician: Lorre Munroe, NP  Primary Gastroenterologist:  Celso Amy, PA-C / Dr. Wyline Mood    CC:  F/U IBS, GERD  HPI: Sara Andrade is a 42 y.o. female returns for 2 month f/u GERD, diarrhea, IBS, loss of appetite, regurgitation, poor digestion for several months.  Hx Constipation in the past.  She tried omeprazole 20 Mg daily which did not control her GERD.  She saw her PCP 04/07/2023 who increased omeprazole to 40 mg daily.    04/2023: Stool test for H. pylori and fecal calprotectin were ordered, yet not completed.  06/01/2023 Colonoscopy to evaluate rectal bleeding by Dr. Tobi Bastos show 1 small 4 mm colon mucosal polyp and 5 mm tubular adenoma polyp removed.  Nonbleeding grade 1 internal hemorrhoids.  Good prep.  7-year repeat.   Labs 02/28/2023: Normal CBC, hemoglobin 14.2.  Normal CMP except elevated creatinine 1.18, GFR 59.  Normal LFTs.  No previous H. pylori test.  No previous EGD.  04/2023: Abdominal x-ray 1 view: Average stool burden.  No evidence of constipation or bowel obstruction.   Abdominal pelvic CT 12/2020 showed no acute abnormality.  Treated with a gallon of GoLytely for constipation.  Current symptoms: GERD has improved and is controlled on omeprazole 40 Mg daily.  She denies breakthrough heartburn.  She is taking dicyclomine 10 Mg 3 times daily.  Abdominal cramping with benefit.  She has less abdominal cramping.  Continues to have approximately 4 loose stools daily.  Occasional formed bowel movement.  No recent episodes of rectal bleeding.  Current Outpatient Medications  Medication Sig Dispense Refill   clonazePAM (KLONOPIN) 0.5 MG tablet Take 0.5 mg by mouth daily as needed.     diazepam (VALIUM) 5 MG tablet Take 5 mg by mouth every 6 (six) hours as needed.     dicyclomine (BENTYL) 10 MG capsule Take 1 capsule (10 mg total) by  mouth 3 (three) times daily before meals. 90 capsule 2   felodipine (PLENDIL) 5 MG 24 hr tablet TAKE 1 TABLET (5 MG TOTAL) BY MOUTH DAILY. 90 tablet 1   fluvoxaMINE (LUVOX) 100 MG tablet Take 100 mg by mouth at bedtime.     gabapentin (NEURONTIN) 300 MG capsule Take 1 capsule (300 mg total) by mouth 3 (three) times daily. 270 capsule 1   ibuprofen (ADVIL) 600 MG tablet TAKE 1 TABLET BY MOUTH EVERY 8 HOURS AS NEEDED 90 tablet 0   lamoTRIgine (LAMICTAL) 100 MG tablet Take 200 mg by mouth daily.     lovastatin (MEVACOR) 10 MG tablet TAKE 1 TABLET BY MOUTH EVERYDAY AT BEDTIME 90 tablet 3   methocarbamol (ROBAXIN) 500 MG tablet TAKE 1 TABLET BY MOUTH EVERY 8 HOURS AS NEEDED FOR MUSCLE SPASMS 90 tablet 0   nortriptyline (PAMELOR) 25 MG capsule Take 25 mg by mouth at bedtime.     olmesartan (BENICAR) 40 MG tablet Take 1 tablet (40 mg total) by mouth daily. 90 tablet 0   omeprazole (PRILOSEC) 40 MG capsule TAKE 1 CAPSULE BY MOUTH EVERY DAY BEFORE BREAKFAST 30 capsule 2   ondansetron (ZOFRAN-ODT) 4 MG disintegrating tablet Take 1 tablet (4 mg total) by mouth every 8 (eight) hours as needed for nausea or vomiting. 30 tablet 0   prazosin (MINIPRESS) 5 MG capsule Take 5 mg by mouth at bedtime.     QELBREE 200 MG 24  hr capsule Take 400 mg by mouth daily.     REXULTI 2 MG TABS tablet Take 2 mg by mouth daily.     No current facility-administered medications for this visit.    Allergies as of 07/03/2023 - Review Complete 06/06/2023  Allergen Reaction Noted   Bee venom Rash 01/21/2021   Escitalopram Other (See Comments) 09/22/2021   Lactose Diarrhea 06/04/2015   Prednisone  12/25/2020   Sertraline Diarrhea 08/25/2015   Lisinopril Cough and Other (See Comments) 05/22/2015    Past Medical History:  Diagnosis Date   Agoraphobia with panic disorder 03/21/2020   Have not left the house alone or driven since 2021     Anxiety    Complication of anesthesia    Depression    GERD (gastroesophageal reflux  disease)    Hypertension     Past Surgical History:  Procedure Laterality Date   ABDOMINAL HYSTERECTOMY     COLONOSCOPY WITH PROPOFOL N/A 06/01/2023   Procedure: COLONOSCOPY WITH PROPOFOL;  Surgeon: Wyline Mood, MD;  Location[facility] END[facility]COPY;  Service: Gastroenterology;  Laterality: N/A;   POLYPECTOMY  06/01/2023   Procedure: POLYPECTOMY;  Surgeon: Wyline Mood, MD;  Location: [facility] ENDOSCOPY;  Service: Gastroenterology;;    Review of Systems:    All systems reviewed and negative except where noted in HPI.   Physical Examination:   There were no vitals taken for this visit.  General: Well-nourished, well-developed in no acute distress.  Neuro: Alert and oriented x 3.  Grossly intact.  Psych: Alert and cooperative, normal mood and affect.   Imaging Studies: No results found.  Assessment and Plan:   Sara Andrade is a 42 y.o. y/o female returns for followup of:  Irritable bowel syndrome, mixed  Low FODMAP diet handout given and discussed.  Continue dicyclomine 10 Mg 3 times daily as needed abdominal cramping.  Start Benefiber powder, mix 1 or 2 tablespoons intermittent once daily.  Start align probiotic 1 capsule once daily.  If she develops worsening diarrhea, then I will order stool studies to check GI pathogen panel and C. difficile.  Stool studies are not necessary at this time.  GERD -improved and controlled on PPI  Continue omeprazole 40 Mg once daily.  3.  History of adenomatous colon polyp  Plan for 7-year repeat colonoscopy 05/2030.   Celso Amy, PA-C  Follow up in 6 months.

## 2023-07-06 DIAGNOSIS — F4310 Post-traumatic stress disorder, unspecified: Secondary | ICD-10-CM | POA: Diagnosis not present

## 2023-07-10 DIAGNOSIS — F4310 Post-traumatic stress disorder, unspecified: Secondary | ICD-10-CM | POA: Diagnosis not present

## 2023-07-18 DIAGNOSIS — F4310 Post-traumatic stress disorder, unspecified: Secondary | ICD-10-CM | POA: Diagnosis not present

## 2023-07-25 DIAGNOSIS — F4310 Post-traumatic stress disorder, unspecified: Secondary | ICD-10-CM | POA: Diagnosis not present

## 2023-07-25 DIAGNOSIS — F411 Generalized anxiety disorder: Secondary | ICD-10-CM | POA: Diagnosis not present

## 2023-07-25 DIAGNOSIS — F909 Attention-deficit hyperactivity disorder, unspecified type: Secondary | ICD-10-CM | POA: Diagnosis not present

## 2023-07-25 DIAGNOSIS — F329 Major depressive disorder, single episode, unspecified: Secondary | ICD-10-CM | POA: Diagnosis not present

## 2023-07-27 DIAGNOSIS — F4310 Post-traumatic stress disorder, unspecified: Secondary | ICD-10-CM | POA: Diagnosis not present

## 2023-07-31 ENCOUNTER — Other Ambulatory Visit: Payer: Self-pay | Admitting: Physician Assistant

## 2023-07-31 DIAGNOSIS — K580 Irritable bowel syndrome with diarrhea: Secondary | ICD-10-CM

## 2023-08-03 DIAGNOSIS — F4310 Post-traumatic stress disorder, unspecified: Secondary | ICD-10-CM | POA: Diagnosis not present

## 2023-08-09 DIAGNOSIS — F4310 Post-traumatic stress disorder, unspecified: Secondary | ICD-10-CM | POA: Diagnosis not present

## 2023-08-15 DIAGNOSIS — F4310 Post-traumatic stress disorder, unspecified: Secondary | ICD-10-CM | POA: Diagnosis not present

## 2023-08-23 DIAGNOSIS — F4310 Post-traumatic stress disorder, unspecified: Secondary | ICD-10-CM | POA: Diagnosis not present

## 2023-08-30 DIAGNOSIS — F4310 Post-traumatic stress disorder, unspecified: Secondary | ICD-10-CM | POA: Diagnosis not present

## 2023-09-05 ENCOUNTER — Other Ambulatory Visit: Payer: Self-pay | Admitting: Internal Medicine

## 2023-09-05 DIAGNOSIS — Z1231 Encounter for screening mammogram for malignant neoplasm of breast: Secondary | ICD-10-CM

## 2023-09-11 DIAGNOSIS — F411 Generalized anxiety disorder: Secondary | ICD-10-CM | POA: Diagnosis not present

## 2023-09-11 DIAGNOSIS — F329 Major depressive disorder, single episode, unspecified: Secondary | ICD-10-CM | POA: Diagnosis not present

## 2023-09-11 DIAGNOSIS — F909 Attention-deficit hyperactivity disorder, unspecified type: Secondary | ICD-10-CM | POA: Diagnosis not present

## 2023-09-11 DIAGNOSIS — F4310 Post-traumatic stress disorder, unspecified: Secondary | ICD-10-CM | POA: Diagnosis not present

## 2023-09-13 DIAGNOSIS — F4310 Post-traumatic stress disorder, unspecified: Secondary | ICD-10-CM | POA: Diagnosis not present

## 2023-09-20 DIAGNOSIS — F4310 Post-traumatic stress disorder, unspecified: Secondary | ICD-10-CM | POA: Diagnosis not present

## 2023-10-04 DIAGNOSIS — F4310 Post-traumatic stress disorder, unspecified: Secondary | ICD-10-CM | POA: Diagnosis not present

## 2023-10-05 DIAGNOSIS — F329 Major depressive disorder, single episode, unspecified: Secondary | ICD-10-CM | POA: Diagnosis not present

## 2023-10-05 DIAGNOSIS — F909 Attention-deficit hyperactivity disorder, unspecified type: Secondary | ICD-10-CM | POA: Diagnosis not present

## 2023-10-05 DIAGNOSIS — F411 Generalized anxiety disorder: Secondary | ICD-10-CM | POA: Diagnosis not present

## 2023-10-05 DIAGNOSIS — F4310 Post-traumatic stress disorder, unspecified: Secondary | ICD-10-CM | POA: Diagnosis not present

## 2023-10-11 DIAGNOSIS — F4310 Post-traumatic stress disorder, unspecified: Secondary | ICD-10-CM | POA: Diagnosis not present

## 2023-10-13 ENCOUNTER — Ambulatory Visit: Admitting: Internal Medicine

## 2023-10-13 NOTE — Progress Notes (Deleted)
 Subjective:    Patient ID: Sara Andrade, female    DOB: 06-15-1981, 43 y.o.   MRN: [id number]  HPI    Review of Systems     Past Medical History:  Diagnosis Date   Agoraphobia with panic disorder 03/21/2020   Have not left the house alone or driven since 2021     Anxiety    Complication of anesthesia    Depression    GERD (gastroesophageal reflux disease)    Hypertension     Current Outpatient Medications  Medication Sig Dispense Refill   clonazePAM (KLONOPIN) 0.5 MG tablet Take 0.5 mg by mouth daily as needed.     diazepam (VALIUM) 5 MG tablet Take 5 mg by mouth every 6 (six) hours as needed.     dicyclomine (BENTYL) 10 MG capsule Take 1 capsule (10 mg total) by mouth 3 (three) times daily before meals. 90 capsule 5   felodipine (PLENDIL) 5 MG 24 hr tablet TAKE 1 TABLET (5 MG TOTAL) BY MOUTH DAILY. 90 tablet 1   fluvoxaMINE (LUVOX) 100 MG tablet Take 100 mg by mouth at bedtime.     gabapentin (NEURONTIN) 300 MG capsule Take 1 capsule (300 mg total) by mouth 3 (three) times daily. 270 capsule 1   ibuprofen (ADVIL) 600 MG tablet TAKE 1 TABLET BY MOUTH EVERY 8 HOURS AS NEEDED 90 tablet 0   lamoTRIgine (LAMICTAL) 100 MG tablet Take 200 mg by mouth daily.     lovastatin (MEVACOR) 10 MG tablet TAKE 1 TABLET BY MOUTH EVERYDAY AT BEDTIME 90 tablet 3   methocarbamol (ROBAXIN) 500 MG tablet TAKE 1 TABLET BY MOUTH EVERY 8 HOURS AS NEEDED FOR MUSCLE SPASMS 90 tablet 0   nortriptyline (PAMELOR) 25 MG capsule Take 25 mg by mouth at bedtime.     olmesartan (BENICAR) 40 MG tablet Take 1 tablet (40 mg total) by mouth daily. 90 tablet 0   omeprazole (PRILOSEC) 40 MG capsule TAKE 1 CAPSULE BY MOUTH EVERY DAY BEFORE BREAKFAST 30 capsule 2   ondansetron (ZOFRAN-ODT) 4 MG disintegrating tablet Take 1 tablet (4 mg total) by mouth every 8 (eight) hours as needed for nausea or vomiting. 30 tablet 0   prazosin (MINIPRESS) 5 MG capsule Take 5 mg by mouth at bedtime.     QELBREE 200 MG 24 hr capsule  Take 400 mg by mouth daily.     REXULTI 2 MG TABS tablet Take 2 mg by mouth daily.     No current facility-administered medications for this visit.    Allergies  Allergen Reactions   Bee Venom Rash   Escitalopram Other (See Comments)   Lactose Diarrhea    Gas    Prednisone     Other reaction(s): Unknown   Sertraline Diarrhea    Other reaction(s): Other (See Comments), Other (See Comments) Emotional numbness Emotional numbness    Lisinopril Cough and Other (See Comments)    cough cough     Family History  Adopted: Yes    Social History   Socioeconomic History   Marital status: Married    Spouse name: Not on file   Number of children: Not on file   Years of education: Not on file   Highest education level: Associate degree: occupational, Scientist, product/process development, or vocational program  Occupational History   Not on file  Tobacco Use   Smoking status: Former    Current packs/day: 0.00    Average packs/day: 1 pack/day for 3.0 years (3.0 ttl pk-yrs)    Types:  Cigarettes    Start date: 2009    Quit date: 2012    Years since quitting: 13.2   Smokeless tobacco: Never  Vaping Use   Vaping status: Never Used  Substance and Sexual Activity   Alcohol use: Yes    Comment: occ   Drug use: Never   Sexual activity: Not on file  Other Topics Concern   Not on file  Social History Narrative   Not on file   Social Drivers of Health   Financial Resource Strain: High Risk (10/09/2023)   Overall Financial Resource Strain (CARDIA)    Difficulty of Paying Living Expenses: Very hard  Food Insecurity: Patient Declined (10/09/2023)   Hunger Vital Sign    Worried About Running Out of Food in the Last Year: Patient declined    Ran Out of Food in the Last Year: Patient declined  Transportation Needs: Patient Declined (10/09/2023)   PRAPARE - Administrator, Civil Service (Medical): Patient declined    Lack of Transportation (Non-Medical): Patient declined  Physical Activity:  Unknown (10/09/2023)   Exercise Vital Sign    Days of Exercise per Week: 0 days    Minutes of Exercise per Session: Not on file  Stress: Stress Concern Present (10/09/2023)   [employer] of Occupational Health - Occupational Stress Questionnaire    Feeling of Stress : Very much  Social Connections: Unknown (10/09/2023)   Social Connection and Isolation Panel [NHANES]    Frequency of Communication with Friends and Family: Patient declined    Frequency of Social Gatherings with Friends and Family: Never    Attends Religious Services: Patient declined    Database administrator or Organizations: No    Attends Engineer, structural: Not on file    Marital Status: Married  Catering manager Violence: Not on file     Constitutional: Denies fever, malaise, fatigue, headache or abrupt weight changes.  HEENT: Denies eye pain, eye redness, ear pain, ringing in the ears, wax buildup, runny nose, nasal congestion, bloody nose, or sore throat. Respiratory: Denies difficulty breathing, shortness of breath, cough or sputum production.   Cardiovascular: Denies chest pain, chest tightness, palpitations or swelling in the hands or feet.  Gastrointestinal: Patient reports intermittent reflux, chronic diarrhea.  Denies abdominal pain, bloating, constipation, or blood in the stool.  GU: Denies urgency, frequency, pain with urination, burning sensation, blood in urine, odor or discharge. Musculoskeletal: Patient reports chronic joint and muscle pain, worse in her neck and back.  Denies decrease in range of motion, difficulty with gait, or joint swelling.  Skin: Denies redness, rashes, lesions or ulcercations.  Neurological: Patient reports inattention, paresthesia of upper and lower extremities.  Denies dizziness, difficulty with memory, difficulty with speech or problems with balance and coordination.  Psych: Patient has a history of anxiety and depression.  Denies SI/HI.  No other specific  complaints in a complete review of systems (except as listed in HPI above).  Objective:   Physical Exam   There were no vitals taken for this visit.  Wt Readings from Last 3 Encounters:  07/03/23 195 lb 9.6 oz (88.7 kg)  06/06/23 196 lb 6.4 oz (89.1 kg)  06/01/23 196 lb 12.8 oz (89.3 kg)    General: Appears her stated age, obese, in NAD. Skin: Warm, dry and intact.  Indentions noted in her shoulders from bra. HEENT: Head: normal shape and size; Eyes: sclera white, no icterus, conjunctiva pink, PERRLA and EOMs intact;  Cardiovascular: Normal rate and  rhythm. S1,S2 noted.  No murmur, rubs or gallops noted. No JVD or BLE edema.  Pulmonary/Chest: Normal effort and positive vesicular breath sounds. No respiratory distress. No wheezes, rales or ronchi noted.  Abdomen: Soft and nontender.  Hyperactive bowel sounds.  Musculoskeletal: Bony tenderness noted throughout the cervical, thoracic and lumbar spine pain with palpation of bilateral paralumbar muscles.  Strength 5/5 BUE, 4/5 BLE.  No difficulty with gait.  Neurological: Alert and oriented. Cranial nerves II-XII grossly intact. Coordination normal.  Psychiatric: Mood and affect normal.  Very anxious appearing. Judgment and thought content normal.    BMET    Component Value Date/Time   NA 140 02/28/2023 1108   K 4.6 02/28/2023 1108   CL 104 02/28/2023 1108   CO2 26 02/28/2023 1108   GLUCOSE 107 (H) 02/28/2023 1108   BUN 15 02/28/2023 1108   CREATININE 1.18 (H) 02/28/2023 1108   CALCIUM 9.1 02/28/2023 1108    Lipid Panel     Component Value Date/Time   CHOL 199 02/28/2023 1108   TRIG 121 02/28/2023 1108   HDL 66 02/28/2023 1108   CHOLHDL 3.0 02/28/2023 1108   LDLCALC 110 (H) 02/28/2023 1108    CBC    Component Value Date/Time   WBC 7.1 02/28/2023 1108   RBC 5.09 02/28/2023 1108   HGB 14.2 02/28/2023 1108   HCT 43.1 02/28/2023 1108   PLT 287 02/28/2023 1108   MCV 84.7 02/28/2023 1108   MCH 27.9 02/28/2023 1108    MCHC 32.9 02/28/2023 1108   RDW 12.9 02/28/2023 1108    Hgb A1C Lab Results  Component Value Date   HGBA1C 5.9 (H) 02/28/2023           Assessment & Plan:   Schedule an appointment for your annual exam Nicki Reaper, NP

## 2023-10-18 DIAGNOSIS — F4310 Post-traumatic stress disorder, unspecified: Secondary | ICD-10-CM | POA: Diagnosis not present

## 2023-10-24 ENCOUNTER — Ambulatory Visit: Payer: 59

## 2023-10-25 ENCOUNTER — Encounter: Payer: Self-pay | Admitting: Internal Medicine

## 2023-10-27 ENCOUNTER — Encounter: Admitting: Internal Medicine

## 2023-10-27 NOTE — Progress Notes (Deleted)
 Subjective:    Patient ID: Sara Andrade, female    DOB: 11-10-1980, 43 y.o.   MRN: [id number]  HPI  Patient presents to clinic today for her annual exam.   Flu: 03/2023 Tetanus: 03/2015 COVID: Pfizer x2 Pap smear: Hysterectomy Mammogram: Never Vision screening: as needed Dentist: as needed  Diet: She does eat meat. She consumes some fruits and veggies. She does eat some fried foods. She drinks mostly coffee. 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     diazepam (VALIUM) 5 MG tablet Take 5 mg by mouth every 6 (six) hours as needed.     dicyclomine (BENTYL) 10 MG capsule Take 1 capsule (10 mg total) by mouth 3 (three) times daily before meals. 90 capsule 5   felodipine (PLENDIL) 5 MG 24 hr tablet TAKE 1 TABLET (5 MG TOTAL) BY MOUTH DAILY. 90 tablet 1   fluvoxaMINE (LUVOX) 100 MG tablet Take 100 mg by mouth at bedtime.     gabapentin (NEURONTIN) 300 MG capsule Take 1 capsule (300 mg total) by mouth 3 (three) times daily. 270 capsule 1   ibuprofen (ADVIL) 600 MG tablet TAKE 1 TABLET BY MOUTH EVERY 8 HOURS AS NEEDED 90 tablet 0   lamoTRIgine (LAMICTAL) 100 MG tablet Take 200 mg by mouth daily.     lovastatin (MEVACOR) 10 MG tablet TAKE 1 TABLET BY MOUTH EVERYDAY AT BEDTIME 90 tablet 3   methocarbamol (ROBAXIN) 500 MG tablet TAKE 1 TABLET BY MOUTH EVERY 8 HOURS AS NEEDED FOR MUSCLE SPASMS 90 tablet 0   nortriptyline (PAMELOR) 25 MG capsule Take 25 mg by mouth at bedtime.     olmesartan (BENICAR) 40 MG tablet Take 1 tablet (40 mg total) by mouth daily. 90 tablet 0   omeprazole (PRILOSEC) 40  MG capsule TAKE 1 CAPSULE BY MOUTH EVERY DAY BEFORE BREAKFAST 30 capsule 2   ondansetron (ZOFRAN-ODT) 4 MG disintegrating tablet Take 1 tablet (4 mg total) by mouth every 8 (eight) hours as needed for nausea or vomiting. 30 tablet 0   prazosin (MINIPRESS) 5 MG capsule Take 5 mg by mouth at bedtime.     QELBREE 200 MG 24 hr capsule Take 400 mg by mouth daily.     REXULTI 2 MG TABS tablet Take 2 mg by mouth daily.     No current facility-administered medications for this visit.    Allergies  Allergen Reactions   Bee Venom Rash   Escitalopram Other (See Comments)   Lactose Diarrhea    Gas    Prednisone     Other reaction(s): Unknown   Sertraline Diarrhea    Other reaction(s): Other (See Comments), Other (See Comments) Emotional numbness Emotional numbness    Lisinopril Cough and Other (See Comments)    cough cough     Family History  Adopted: Yes    Social History   Socioeconomic History   Marital status: Married    Spouse name: Not on file   Number of children: Not on file   Years of education: Not  on file   Highest education level: Associate degree: occupational, technical, or vocational program  Occupational History   Not on file  Tobacco Use   Smoking status: Former    Current packs/day: 0.00    Average packs/day: 1 pack/day for 3.0 years (3.0 ttl pk-yrs)    Types: Cigarettes    Start date: 2009    Quit date: 2012    Years since quitting: 13.2   Smokeless tobacco: Never  Vaping Use   Vaping status: Never Used  Substance and Sexual Activity   Alcohol use: Yes    Comment: occ   Drug use: Never   Sexual activity: Not on file  Other Topics Concern   Not on file  Social History Narrative   Not on file   Social Drivers of Health   Financial Resource Strain: High Risk (10/09/2023)   Overall Financial Resource Strain (CARDIA)    Difficulty of Paying Living Expenses: Very hard  Food Insecurity: Patient Declined (10/09/2023)   Hunger Vital Sign    Worried  About Running Out of Food in the Last Year: Patient declined    Ran Out of Food in the Last Year: Patient declined  Transportation Needs: Patient Declined (10/09/2023)   PRAPARE - Administrator, Civil Service (Medical): Patient declined    Lack of Transportation (Non-Medical): Patient declined  Physical Activity: Unknown (10/09/2023)   Exercise Vital Sign    Days of Exercise per Week: 0 days    Minutes of Exercise per Session: Not on file  Stress: Stress Concern Present (10/09/2023)   [employer] of Occupational Health - Occupational Stress Questionnaire    Feeling of Stress : Very much  Social Connections: Unknown (10/09/2023)   Social Connection and Isolation Panel [NHANES]    Frequency of Communication with Friends and Family: Patient declined    Frequency of Social Gatherings with Friends and Family: Never    Attends Religious Services: Patient declined    Database administrator or Organizations: No    Attends Engineer, structural: Not on file    Marital Status: Married  Catering manager Violence: Not on file     Constitutional: Denies fever, malaise, fatigue, headache or abrupt weight changes.  HEENT: Denies eye pain, eye redness, ear pain, ringing in the ears, wax buildup, runny nose, nasal congestion, bloody nose, or sore throat. Respiratory: Denies difficulty breathing, shortness of breath, cough or sputum production.   Cardiovascular: Denies chest pain, chest tightness, palpitations or swelling in the hands or feet.  Gastrointestinal: Patient reports diarrhea, reflux.  Denies abdominal pain, bloating, diarrhea or blood in the stool.  GU: Denies urgency, frequency, pain with urination, burning sensation, blood in urine, odor or discharge. Musculoskeletal: Patient reports chronic muscle and joint pain.  Denies decrease in range of motion, difficulty with gait, or joint swelling.  Skin: Denies redness, rashes, lesions or ulcercations.  Neurological:  Patient reports inattention.  Denies dizziness, difficulty with memory, difficulty with speech or problems with balance and coordination.  Psych: Patient has a history of anxiety and depression.  Denies SI/HI.  No other specific complaints in a complete review of systems (except as listed in HPI above).  Objective:   Physical Exam There were no vitals taken for this visit.  Wt Readings from Last 3 Encounters:  07/03/23 195 lb 9.6 oz (88.7 kg)  06/06/23 196 lb 6.4 oz (89.1 kg)  06/01/23 196 lb 12.8 oz (89.3 kg)    General: Appears her stated age, obese,  in NAD. Skin: Warm, dry and intact. Eczema noted of hands HEENT: Head: normal shape and size; Eyes: sclera white, no icterus, conjunctiva pink, PERRLA and EOMs intact;  Neck:  Neck supple, trachea midline. No masses, lumps or thyromegaly present.  Cardiovascular: Normal rate and rhythm. S1,S2 noted.  No murmur, rubs or gallops noted. No JVD or BLE edema.  Pulmonary/Chest: Normal effort and positive vesicular breath sounds. No respiratory distress. No wheezes, rales or ronchi noted.  Abdomen: Soft and nontender. Normal bowel sounds.  Musculoskeletal: Strength 5/5 BUE/BLE. No difficulty with gait.  Neurological: Alert and oriented. Cranial nerves II-XII grossly intact. Coordination normal.  Psychiatric: Mood and affect normal. Behavior is normal. Judgment and thought content normal.        Assessment & Plan:   Preventative Health Maintenance:  Encouraged her to get a flu shot the fall Tetanus UTD Encouraged her to get a COVID booster She no longer needs Pap smears She will start screening mammograms at 43 Encouraged her to consume a balanced diet and exercise regimen Advised her to see an eye doctor and dentist annually Will check CBC, c-Met, lipid, A1c  RTC in 6 months, follow-up chronic conditions  Nicki Reaper, NP

## 2023-11-01 DIAGNOSIS — F4310 Post-traumatic stress disorder, unspecified: Secondary | ICD-10-CM | POA: Diagnosis not present

## 2023-11-05 ENCOUNTER — Other Ambulatory Visit: Payer: Self-pay | Admitting: Internal Medicine

## 2023-11-06 NOTE — Telephone Encounter (Signed)
 Requested Prescriptions  Pending Prescriptions Disp Refills   felodipine  (PLENDIL ) 5 MG 24 hr tablet [Pharmacy Med Name: FELODIPINE  ER 5 MG TABLET] 30 tablet 2    Sig: TAKE 1 TABLET (5 MG TOTAL) BY MOUTH DAILY.     Cardiovascular: Calcium Channel Blockers 2 Failed - 11/06/2023  2:18 PM      Failed - Valid encounter within last 6 months    Recent Outpatient Visits   None            Passed - Last BP in normal range    BP Readings from Last 1 Encounters:  07/03/23 108/76         Passed - Last Heart Rate in normal range    Pulse Readings from Last 1 Encounters:  07/03/23 86

## 2023-11-07 DIAGNOSIS — F4310 Post-traumatic stress disorder, unspecified: Secondary | ICD-10-CM | POA: Diagnosis not present

## 2023-11-15 DIAGNOSIS — F4310 Post-traumatic stress disorder, unspecified: Secondary | ICD-10-CM | POA: Diagnosis not present

## 2023-11-16 DIAGNOSIS — F909 Attention-deficit hyperactivity disorder, unspecified type: Secondary | ICD-10-CM | POA: Diagnosis not present

## 2023-11-16 DIAGNOSIS — F411 Generalized anxiety disorder: Secondary | ICD-10-CM | POA: Diagnosis not present

## 2023-11-16 DIAGNOSIS — F329 Major depressive disorder, single episode, unspecified: Secondary | ICD-10-CM | POA: Diagnosis not present

## 2023-11-16 DIAGNOSIS — F4310 Post-traumatic stress disorder, unspecified: Secondary | ICD-10-CM | POA: Diagnosis not present

## 2023-11-22 DIAGNOSIS — F4310 Post-traumatic stress disorder, unspecified: Secondary | ICD-10-CM | POA: Diagnosis not present

## 2023-11-23 ENCOUNTER — Ambulatory Visit (INDEPENDENT_AMBULATORY_CARE_PROVIDER_SITE_OTHER): Admitting: Internal Medicine

## 2023-11-23 ENCOUNTER — Encounter: Payer: Self-pay | Admitting: Internal Medicine

## 2023-11-23 VITALS — BP 110/72 | Ht 62.0 in | Wt 190.8 lb

## 2023-11-23 DIAGNOSIS — E66811 Obesity, class 1: Secondary | ICD-10-CM | POA: Diagnosis not present

## 2023-11-23 DIAGNOSIS — R634 Abnormal weight loss: Secondary | ICD-10-CM | POA: Diagnosis not present

## 2023-11-23 DIAGNOSIS — E6609 Other obesity due to excess calories: Secondary | ICD-10-CM | POA: Diagnosis not present

## 2023-11-23 DIAGNOSIS — R7303 Prediabetes: Secondary | ICD-10-CM

## 2023-11-23 DIAGNOSIS — E782 Mixed hyperlipidemia: Secondary | ICD-10-CM | POA: Diagnosis not present

## 2023-11-23 DIAGNOSIS — Z0001 Encounter for general adult medical examination with abnormal findings: Secondary | ICD-10-CM | POA: Diagnosis not present

## 2023-11-23 DIAGNOSIS — Z6834 Body mass index (BMI) 34.0-34.9, adult: Secondary | ICD-10-CM | POA: Diagnosis not present

## 2023-11-23 NOTE — Assessment & Plan Note (Signed)
 Encouraged diet and exercise for weight loss ?

## 2023-11-23 NOTE — Patient Instructions (Signed)

## 2023-11-23 NOTE — Progress Notes (Signed)
 Subjective:    Patient ID: Sara Andrade, female    DOB: 12-Mar-1981, 43 y.o.   MRN: [id number]  Patient presents to clinic today for her annual exam.   Flu: 03/2023 Tetanus: 03/2015 COVID: Pfizer x2 Pap smear: Hysterectomy Mammogram: Never, scheduled 11/2023 Vision screening: as needed Dentist: as needed  Diet: She does eat meat. She consumes some fruits and veggies. She does eat some fried foods. She drinks mostly coffee. 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     diazepam (VALIUM) 5 MG tablet Take 5 mg by mouth every 6 (six) hours as needed.     dicyclomine  (BENTYL ) 10 MG capsule Take 1 capsule (10 mg total) by mouth 3 (three) times daily before meals. 90 capsule 5   felodipine  (PLENDIL ) 5 MG 24 hr tablet TAKE 1 TABLET (5 MG TOTAL) BY MOUTH DAILY. 30 tablet 2   fluvoxaMINE (LUVOX) 100 MG tablet Take 100 mg by mouth at bedtime.     gabapentin  (NEURONTIN ) 300 MG capsule Take 1 capsule (300 mg total) by mouth 3 (three) times daily. 270 capsule 1   ibuprofen  (ADVIL ) 600 MG tablet TAKE 1 TABLET BY MOUTH EVERY 8 HOURS AS NEEDED 90 tablet 0   lamoTRIgine (LAMICTAL) 100 MG tablet Take 200 mg by mouth daily.     lovastatin  (MEVACOR ) 10 MG tablet TAKE 1 TABLET BY MOUTH EVERYDAY AT BEDTIME 90 tablet 3   methocarbamol  (ROBAXIN ) 500 MG tablet TAKE 1 TABLET BY MOUTH EVERY 8 HOURS AS NEEDED FOR MUSCLE SPASMS 90 tablet 0   nortriptyline (PAMELOR) 25 MG capsule Take 25 mg by mouth at bedtime.     olmesartan  (BENICAR ) 40 MG tablet Take 1 tablet (40 mg total) by mouth daily. 90 tablet 0   omeprazole   (PRILOSEC) 40 MG capsule TAKE 1 CAPSULE BY MOUTH EVERY DAY BEFORE BREAKFAST 30 capsule 2   ondansetron  (ZOFRAN -ODT) 4 MG disintegrating tablet Take 1 tablet (4 mg total) by mouth every 8 (eight) hours as needed for nausea or vomiting. 30 tablet 0   prazosin (MINIPRESS) 5 MG capsule Take 5 mg by mouth at bedtime.     QELBREE 200 MG 24 hr capsule Take 400 mg by mouth daily.     REXULTI 2 MG TABS tablet Take 2 mg by mouth daily.     No current facility-administered medications for this visit.    Allergies  Allergen Reactions   Bee Venom Rash   Escitalopram Other (See Comments)   Lactose Diarrhea    Gas    Prednisone     Other reaction(s): Unknown   Sertraline Diarrhea    Other reaction(s): Other (See Comments), Other (See Comments) Emotional numbness Emotional numbness    Lisinopril Cough and Other (See Comments)    cough cough     Family History  Adopted: Yes    Social History   Socioeconomic History   Marital status: Married    Spouse name: Not on file   Number of children: Not on file   Years of education: Not  on file   Highest education level: Associate degree: occupational, technical, or vocational program  Occupational History   Not on file  Tobacco Use   Smoking status: Former    Current packs/day: 0.00    Average packs/day: 1 pack/day for 3.0 years (3.0 ttl pk-yrs)    Types: Cigarettes    Start date: 2009    Quit date: 2012    Years since quitting: 13.3   Smokeless tobacco: Never  Vaping Use   Vaping status: Never Used  Substance and Sexual Activity   Alcohol use: Yes    Comment: occ   Drug use: Never   Sexual activity: Not on file  Other Topics Concern   Not on file  Social History Narrative   Not on file   Social Drivers of Health   Financial Resource Strain: High Risk (10/09/2023)   Overall Financial Resource Strain (CARDIA)    Difficulty of Paying Living Expenses: Very hard  Food Insecurity: Patient Declined (10/09/2023)   Hunger Vital Sign     Worried About Running Out of Food in the Last Year: Patient declined    Ran Out of Food in the Last Year: Patient declined  Transportation Needs: Patient Declined (10/09/2023)   PRAPARE - Administrator, Civil Service (Medical): Patient declined    Lack of Transportation (Non-Medical): Patient declined  Physical Activity: Unknown (10/09/2023)   Exercise Vital Sign    Days of Exercise per Week: 0 days    Minutes of Exercise per Session: Not on file  Stress: Stress Concern Present (10/09/2023)   [employer] of Occupational Health - Occupational Stress Questionnaire    Feeling of Stress : Very much  Social Connections: Unknown (10/09/2023)   Social Connection and Isolation Panel [NHANES]    Frequency of Communication with Friends and Family: Patient declined    Frequency of Social Gatherings with Friends and Family: Never    Attends Religious Services: Patient declined    Database administrator or Organizations: No    Attends Engineer, structural: Not on file    Marital Status: Married  Catering manager Violence: Not on file     Constitutional: Pt reports unintentional weight loss. Denies fever, malaise, fatigue, headache.  HEENT: Patient reports postnasal drip.  Denies eye pain, eye redness, ear pain, ringing in the ears, wax buildup, runny nose, nasal congestion, bloody nose, or sore throat. Respiratory: Pt reports cough. Denies difficulty breathing, shortness of breath, cough or sputum production.   Cardiovascular: Denies chest pain, chest tightness, palpitations or swelling in the hands or feet.  Gastrointestinal: Patient reports diarrhea, reflux.  Denies abdominal pain, bloating, diarrhea or blood in the stool.  GU: Denies urgency, frequency, pain with urination, burning sensation, blood in urine, odor or discharge. Musculoskeletal: Patient reports chronic muscle and joint pain.  Denies decrease in range of motion, difficulty with gait, or joint swelling.   Skin: Denies redness, rashes, lesions or ulcercations.  Neurological: Patient reports inattention, insomnia, night sweats.  Denies dizziness, difficulty with memory, difficulty with speech or problems with balance and coordination.  Psych: Patient has a history of anxiety and depression.  Denies SI/HI.  No other specific complaints in a complete review of systems (except as listed in HPI above).  Objective:   Physical Exam BP 110/72 (BP Location: Right Arm, Patient Position: Sitting, Cuff Size: Normal)   Ht 5\' 2"  (1.575 m)   Wt 190 lb 12.8 oz (86.5 kg)   BMI 34.90 kg/m  Wt Readings from Last 3 Encounters:  07/03/23 195 lb 9.6 oz (88.7 kg)  06/06/23 196 lb 6.4 oz (89.1 kg)  06/01/23 196 lb 12.8 oz (89.3 kg)    General: Appears her stated age, obese, in NAD. Skin: Warm, dry and intact.  HEENT: Head: normal shape and size; Eyes: sclera white, no icterus, conjunctiva pink, PERRLA and EOMs intact;  Neck:  Neck supple, trachea midline. No masses, lumps or thyromegaly present.  Cardiovascular: Normal rate and rhythm. S1,S2 noted.  No murmur, rubs or gallops noted. No JVD or BLE edema.  Pulmonary/Chest: Normal effort and positive vesicular breath sounds. No respiratory distress. No wheezes, rales or ronchi noted.  Abdomen: Soft and nontender. Normal bowel sounds.  Musculoskeletal: Multiple tender points noted of upper or lower extremities.  Strength 5/5 BUE/BLE. No difficulty with gait.  Neurological: Alert and oriented. Cranial nerves II-XII grossly intact. Coordination normal.  Psychiatric: Mood and affect normal. Behavior is normal. Judgment and thought content normal.        Assessment & Plan:   Preventative Health Maintenance:  Encouraged her to get a flu shot the fall Tetanus UTD Encouraged her to get a COVID booster She no longer needs Pap smears Mammogram has been scheduled Encouraged her to consume a balanced diet and exercise regimen Advised her to see an eye doctor  and dentist annually Will check CBC, c-Met, lipid, A1c today  Unintentional weight loss:  Will check TSH today Not overall concerning given that she is only lost 6 pounds in 6 months  RTC in 6 months, follow-up chronic conditions  Helayne Lo, NP

## 2023-11-24 ENCOUNTER — Encounter: Payer: Self-pay | Admitting: Internal Medicine

## 2023-11-24 LAB — COMPREHENSIVE METABOLIC PANEL WITH GFR
AG Ratio: 1.8 (calc) (ref 1.0–2.5)
ALT: 15 U/L (ref 6–29)
AST: 12 U/L (ref 10–30)
Albumin: 4.1 g/dL (ref 3.6–5.1)
Alkaline phosphatase (APISO): 77 U/L (ref 31–125)
BUN/Creatinine Ratio: 15 (calc) (ref 6–22)
BUN: 16 mg/dL (ref 7–25)
CO2: 30 mmol/L (ref 20–32)
Calcium: 9.2 mg/dL (ref 8.6–10.2)
Chloride: 107 mmol/L (ref 98–110)
Creat: 1.04 mg/dL — ABNORMAL HIGH (ref 0.50–0.99)
Globulin: 2.3 g/dL (ref 1.9–3.7)
Glucose, Bld: 68 mg/dL (ref 65–139)
Potassium: 5 mmol/L (ref 3.5–5.3)
Sodium: 142 mmol/L (ref 135–146)
Total Bilirubin: 0.2 mg/dL (ref 0.2–1.2)
Total Protein: 6.4 g/dL (ref 6.1–8.1)
eGFR: 68 mL/min/{1.73_m2} (ref >=60)

## 2023-11-24 LAB — CBC
HCT: 43.9 % (ref 35.0–45.0)
Hemoglobin: 14.6 g/dL (ref 11.7–15.5)
MCH: 28.5 pg (ref 27.0–33.0)
MCHC: 33.3 g/dL (ref 32.0–36.0)
MCV: 85.7 fL (ref 80.0–100.0)
MPV: 10.3 fL (ref 7.5–12.5)
Platelets: 299 10*3/uL (ref 140–400)
RBC: 5.12 10*6/uL — ABNORMAL HIGH (ref 3.80–5.10)
RDW: 13 % (ref 11.0–15.0)
WBC: 7.2 10*3/uL (ref 3.8–10.8)

## 2023-11-24 LAB — HEMOGLOBIN A1C
Hgb A1c MFr Bld: 5.9 % — ABNORMAL HIGH (ref <5.7)
Mean Plasma Glucose: 123 mg/dL
eAG (mmol/L): 6.8 mmol/L

## 2023-11-24 LAB — LIPID PANEL
Cholesterol: 200 mg/dL — ABNORMAL HIGH (ref <200)
HDL: 62 mg/dL (ref >=50)
LDL Cholesterol (Calc): 88 mg/dL
Non-HDL Cholesterol (Calc): 138 mg/dL — ABNORMAL HIGH (ref <130)
Total CHOL/HDL Ratio: 3.2 (calc) (ref <5.0)
Triglycerides: 376 mg/dL — ABNORMAL HIGH (ref <150)

## 2023-11-24 LAB — TSH: TSH: 0.69 m[IU]/L

## 2023-11-24 MED ORDER — LOVASTATIN 20 MG PO TABS: 20.0000 mg | ORAL_TABLET | Freq: Every day | ORAL | 1 refills | Status: DC

## 2023-11-27 ENCOUNTER — Ambulatory Visit

## 2023-12-04 DIAGNOSIS — F329 Major depressive disorder, single episode, unspecified: Secondary | ICD-10-CM | POA: Diagnosis not present

## 2023-12-04 DIAGNOSIS — F411 Generalized anxiety disorder: Secondary | ICD-10-CM | POA: Diagnosis not present

## 2023-12-04 DIAGNOSIS — F909 Attention-deficit hyperactivity disorder, unspecified type: Secondary | ICD-10-CM | POA: Diagnosis not present

## 2023-12-04 DIAGNOSIS — M25551 Pain in right hip: Secondary | ICD-10-CM | POA: Diagnosis not present

## 2023-12-04 DIAGNOSIS — R269 Unspecified abnormalities of gait and mobility: Secondary | ICD-10-CM | POA: Diagnosis not present

## 2023-12-04 DIAGNOSIS — F4310 Post-traumatic stress disorder, unspecified: Secondary | ICD-10-CM | POA: Diagnosis not present

## 2023-12-05 DIAGNOSIS — F4310 Post-traumatic stress disorder, unspecified: Secondary | ICD-10-CM | POA: Diagnosis not present

## 2023-12-12 DIAGNOSIS — F4310 Post-traumatic stress disorder, unspecified: Secondary | ICD-10-CM | POA: Diagnosis not present

## 2023-12-19 DIAGNOSIS — F4310 Post-traumatic stress disorder, unspecified: Secondary | ICD-10-CM | POA: Diagnosis not present

## 2024-01-03 DIAGNOSIS — F4310 Post-traumatic stress disorder, unspecified: Secondary | ICD-10-CM | POA: Diagnosis not present

## 2024-01-07 ENCOUNTER — Other Ambulatory Visit: Payer: Self-pay | Admitting: Internal Medicine

## 2024-01-09 NOTE — Telephone Encounter (Signed)
 Requested Prescriptions  Pending Prescriptions Disp Refills   gabapentin  (NEURONTIN ) 300 MG capsule [Pharmacy Med Name: GABAPENTIN  300 MG CAPSULE] 90 capsule 5    Sig: TAKE 1 CAPSULE BY MOUTH THREE TIMES A DAY     Neurology: Anticonvulsants - gabapentin  Failed - 01/09/2024  2:18 PM      Failed - Cr in normal range and within 360 days    Creat  Date Value Ref Range Status  11/23/2023 1.04 (H) 0.50 - 0.99 mg/dL Final         Failed - Valid encounter within last 12 months    Recent Outpatient Visits           1 month ago Encounter for general adult medical examination with abnormal findings   [place] [facility] [place], [place], NP              Passed - Completed PHQ-2 or PHQ-9 in the last 360 days

## 2024-01-10 DIAGNOSIS — F4310 Post-traumatic stress disorder, unspecified: Secondary | ICD-10-CM | POA: Diagnosis not present

## 2024-01-11 DIAGNOSIS — F909 Attention-deficit hyperactivity disorder, unspecified type: Secondary | ICD-10-CM | POA: Diagnosis not present

## 2024-01-11 DIAGNOSIS — F411 Generalized anxiety disorder: Secondary | ICD-10-CM | POA: Diagnosis not present

## 2024-01-11 DIAGNOSIS — F329 Major depressive disorder, single episode, unspecified: Secondary | ICD-10-CM | POA: Diagnosis not present

## 2024-01-11 DIAGNOSIS — F4310 Post-traumatic stress disorder, unspecified: Secondary | ICD-10-CM | POA: Diagnosis not present

## 2024-01-17 DIAGNOSIS — F4310 Post-traumatic stress disorder, unspecified: Secondary | ICD-10-CM | POA: Diagnosis not present

## 2024-01-23 DIAGNOSIS — F909 Attention-deficit hyperactivity disorder, unspecified type: Secondary | ICD-10-CM | POA: Diagnosis not present

## 2024-01-23 DIAGNOSIS — F329 Major depressive disorder, single episode, unspecified: Secondary | ICD-10-CM | POA: Diagnosis not present

## 2024-01-23 DIAGNOSIS — F411 Generalized anxiety disorder: Secondary | ICD-10-CM | POA: Diagnosis not present

## 2024-01-23 DIAGNOSIS — F4310 Post-traumatic stress disorder, unspecified: Secondary | ICD-10-CM | POA: Diagnosis not present

## 2024-02-02 DIAGNOSIS — F4310 Post-traumatic stress disorder, unspecified: Secondary | ICD-10-CM | POA: Diagnosis not present

## 2024-02-07 DIAGNOSIS — F4310 Post-traumatic stress disorder, unspecified: Secondary | ICD-10-CM | POA: Diagnosis not present

## 2024-02-14 DIAGNOSIS — F4310 Post-traumatic stress disorder, unspecified: Secondary | ICD-10-CM | POA: Diagnosis not present

## 2024-02-20 DIAGNOSIS — F4310 Post-traumatic stress disorder, unspecified: Secondary | ICD-10-CM | POA: Diagnosis not present

## 2024-02-20 DIAGNOSIS — F329 Major depressive disorder, single episode, unspecified: Secondary | ICD-10-CM | POA: Diagnosis not present

## 2024-02-20 DIAGNOSIS — F909 Attention-deficit hyperactivity disorder, unspecified type: Secondary | ICD-10-CM | POA: Diagnosis not present

## 2024-02-20 DIAGNOSIS — F411 Generalized anxiety disorder: Secondary | ICD-10-CM | POA: Diagnosis not present

## 2024-03-06 DIAGNOSIS — F4310 Post-traumatic stress disorder, unspecified: Secondary | ICD-10-CM | POA: Diagnosis not present

## 2024-03-09 ENCOUNTER — Other Ambulatory Visit: Payer: Self-pay | Admitting: Internal Medicine

## 2024-03-11 NOTE — Telephone Encounter (Signed)
 Requested by interface surescripts. Last OV 3 months ago. Requested Prescriptions  Pending Prescriptions Disp Refills   felodipine  (PLENDIL ) 5 MG 24 hr tablet [Pharmacy Med Name: FELODIPINE  ER 5 MG TABLET] 30 tablet 1    Sig: TAKE 1 TABLET (5 MG TOTAL) BY MOUTH DAILY.     Cardiovascular: Calcium Channel Blockers 2 Passed - 03/11/2024  4:04 PM      Passed - Last BP in normal range    BP Readings from Last 1 Encounters:  11/23/23 110/72         Passed - Last Heart Rate in normal range    Pulse Readings from Last 1 Encounters:  07/03/23 86         Passed - Valid encounter within last 6 months    Recent Outpatient Visits           3 months ago Encounter for general adult medical examination with abnormal findings   [place] [facility] [place], Angeline ORN, NP

## 2024-03-13 DIAGNOSIS — F4310 Post-traumatic stress disorder, unspecified: Secondary | ICD-10-CM | POA: Diagnosis not present

## 2024-03-14 ENCOUNTER — Ambulatory Visit: Payer: Self-pay

## 2024-03-14 NOTE — Telephone Encounter (Signed)
 FYI Only or Action Required?: FYI only for provider.  Patient was last seen in primary care on 11/23/2023 by Antonette Angeline ORN, NP.  Called Nurse Triage reporting Back Pain.  Symptoms began worse over the last couple of months.  Symptoms are: gradually worsening.  Triage Disposition: See PCP Within 2 Weeks  Patient/caregiver understands and will follow disposition?: Yes         Copied from CRM [id number]. Topic: Clinical - Red Word Triage >> Mar 14, 2024  3:55 PM Lauren C wrote: Red Word that prompted transfer to Nurse Triage: Wife courtney calling in for dani. She has fibromyalgia and is having pain in back and hips. Wife was calling to possibly schedule to follow up with Angeline Antonette due to the pain worsening.        Reason for Disposition  Back pain is a chronic symptom (recurrent or ongoing AND present > 4 weeks)  Answer Assessment - Initial Assessment Questions 1. ONSET: When did the pain begin? (e.g., minutes, hours, days)     Worsening over the last 1-2 months  2. LOCATION: Where does it hurt? (upper, mid or lower back)     Lower back  3. SEVERITY: How bad is the pain?  (e.g., Scale 1-10; mild, moderate, or severe)     Moderate  4. PATTERN: Is the pain constant? (e.g., yes, no; constant, intermittent)      Constant  6. CAUSE:  What do you think is causing the back pain?      Fibromyalgia  7. BACK OVERUSE:  Any recent lifting of heavy objects, strenuous work or exercise?     Physical therapy  10. OTHER SYMPTOMS: Do you have any other symptoms? (e.g., fever, abdomen pain, burning with urination, blood in urine)       Hip pain  Protocols used: Back Pain-A-AH

## 2024-03-15 ENCOUNTER — Encounter: Payer: Self-pay | Admitting: Internal Medicine

## 2024-03-15 ENCOUNTER — Telehealth: Admitting: Internal Medicine

## 2024-03-15 DIAGNOSIS — M5412 Radiculopathy, cervical region: Secondary | ICD-10-CM | POA: Diagnosis not present

## 2024-03-15 DIAGNOSIS — M545 Low back pain, unspecified: Secondary | ICD-10-CM | POA: Diagnosis not present

## 2024-03-15 DIAGNOSIS — M797 Fibromyalgia: Secondary | ICD-10-CM

## 2024-03-15 DIAGNOSIS — G8929 Other chronic pain: Secondary | ICD-10-CM

## 2024-03-15 DIAGNOSIS — M546 Pain in thoracic spine: Secondary | ICD-10-CM | POA: Diagnosis not present

## 2024-03-15 MED ORDER — GABAPENTIN 600 MG PO TABS: 600.0000 mg | ORAL_TABLET | Freq: Three times a day (TID) | ORAL | 0 refills | Status: DC

## 2024-03-15 MED ORDER — METHOCARBAMOL 500 MG PO TABS: 500.0000 mg | ORAL_TABLET | Freq: Every evening | ORAL | 0 refills | Status: DC | PRN

## 2024-03-15 NOTE — Progress Notes (Addendum)
 Virtual Visit via Video Note  I connected with Sara Andrade on 03/15/24 at  9:20 AM EDT by a video enabled telemedicine application and verified that I am speaking with the correct person using two identifiers.  Location: Patient: Home Provider: Office  Persons participating in this video call: Angeline Laura, NP and Sara Andrade   I discussed the limitations of evaluation and management by telemedicine and the availability of in person appointments. The patient expressed understanding and agreed to proceed.  History of Present Illness:   Discussed the use of AI scribe software for clinical note transcription with the patient, who gave verbal consent to proceed.  Sara Andrade is a 43 year old female with chronic pain and fibromyalgia who presents with increased pelvic and spinal pain.  She experiences increased pain in her lower abdomen and sacrum, with radiation to the front, causing a sensation of fullness in the bladder or ovaries. The pain is severe, rendering her unable to get out of bed without assistance for two and a half days following physical therapy for her hip. The hip pain causes a sensation of instability and grinding.  She was supposed to have 2 more sessions of physical therapy however she was unable to complete these because she was in so much pain after the first session.  She takes gabapentin  300 mg three times daily, occasionally taking an extra dose for severe pain. Methocarbamol  500 mg helps her relax enough to sleep, but take ibuprofen  600 mg causes hematemesis if taken the night before. She sleeps 14-15 hours daily due to pain.  She has a history of spinal issues with pain radiating from the cervical spine to the lumbar spine and sacrum, described as compression and burning nerves affecting the upper back, shoulders, arms, and hands, which feel swollen like 'Mickey Mouse gloves'. MRI cervical spine from 06/2022 showed:  IMPRESSION: 1. Multilevel  degenerative disc and joint changes as above. 2. Mild C5-6 and C6-7 central canal stenosis. 3. Multilevel neuroforaminal stenosis including mild-to-moderate right C4-5, moderate to severe left and moderate right C5-6, and moderate to severe right and mild-to-moderate left C6-7 neuroforaminal stenosis.  MRI lumbar spine from 12/2022 showed:  IMPRESSION: 1. Mild left L4-5 neural foraminal stenosis secondary to left asymmetric disc bulge. 2. Otherwise mild degenerative disc disease without spinal canal or neural foraminal stenosis.  MRI right hip from 04/2024 showed:   IMPRESSION: 1. Suspected degenerative tear of the right acetabular labrum laterally with adjacent paralabral cyst. This could be more definitively evaluated with MR arthrography, if clinically warranted. 2. Small bilateral hip joint effusions, larger on the right. 3. No acute osseous findings or significant arthropathic changes. 4. Possible mild gluteus tendinosis bilaterally.  She has not pursued treatment for the cervical radiculitis due to anxiety about injections. No history of back or neck surgeries.  She has been seeing Dr. Kathlynn regarding her hip pain.  She has an appointment scheduled with him October 3.  She has a history of fibromyalgia contributing to her chronic pain.       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     diazepam (VALIUM) 5 MG tablet Take 5 mg by mouth every 6 (six) hours as needed.     dicyclomine  (BENTYL ) 10 MG capsule Take 1 capsule (10 mg  total) by mouth 3 (three) times daily before meals. 90 capsule 5   felodipine  (PLENDIL ) 5 MG 24 hr tablet TAKE 1 TABLET (5 MG TOTAL) BY MOUTH DAILY. 30 tablet 1   fluvoxaMINE (LUVOX) 100 MG tablet Take 300 mg by mouth at bedtime.     gabapentin  (NEURONTIN ) 300 MG capsule TAKE 1 CAPSULE BY MOUTH THREE TIMES A DAY 90 capsule 5   ibuprofen  (ADVIL ) 600 MG tablet TAKE 1 TABLET BY MOUTH EVERY 8 HOURS AS NEEDED 90 tablet 0   lamoTRIgine (LAMICTAL) 100 MG tablet Take 200 mg by mouth daily.     lovastatin  (MEVACOR ) 20 MG tablet Take 1 tablet (20 mg total) by mouth at bedtime. 90 tablet 1   olmesartan  (BENICAR ) 40 MG tablet Take 1 tablet (40 mg total) by mouth daily. 90 tablet 0   omeprazole  (PRILOSEC) 40 MG capsule TAKE 1 CAPSULE BY MOUTH EVERY DAY BEFORE BREAKFAST 30 capsule 2   prazosin (MINIPRESS) 5 MG capsule Take 5 mg by mouth at bedtime.     QELBREE 200 MG 24 hr capsule Take 400 mg by mouth daily.     REXULTI 2 MG TABS tablet Take 4 mg by mouth daily.     No current facility-administered medications for this visit.    Allergies  Allergen Reactions   Bee Venom Rash   Escitalopram Other (See Comments)   Lactose Diarrhea    Gas    Prednisone     Other reaction(s): Unknown   Sertraline Diarrhea    Other reaction(s): Other (See Comments), Other (See Comments) Emotional numbness Emotional numbness    Lisinopril Cough and Other (See Comments)    cough cough     Family History  Adopted: Yes    Social History   Socioeconomic History   Marital status: Married    Spouse name: Not on file   Number of children: Not on file   Years of education: Not on file   Highest education level: Associate degree: occupational, Scientist, product/process development, or vocational program  Occupational History   Not on file  Tobacco Use   Smoking status: Former    Current packs/day: 0.00    Average packs/day: 1 pack/day for 3.0 years (3.0 ttl pk-yrs)    Types: Cigarettes    Start date: 2009    Quit date: 2012    Years  since quitting: 13.6   Smokeless tobacco: Never  Vaping Use   Vaping status: Never Used  Substance and Sexual Activity   Alcohol use: Yes    Comment: occ   Drug use: Never   Sexual activity: Yes    Comment: I'm gay so nah  Other Topics Concern   Not on file  Social History Narrative   Not on file   Social Drivers of Health   Financial Resource Strain: High Risk (03/14/2024)   Overall Financial Resource Strain (CARDIA)    Difficulty of Paying Living Expenses: Very hard  Food Insecurity: Patient Declined (03/14/2024)   Hunger Vital Sign    Worried About Running Out of Food in the Last Year: Patient declined    Ran Out of Food in the Last Year: Patient declined  Transportation Needs: Unmet Transportation Needs (03/14/2024)   PRAPARE - Transportation    Lack of Transportation (Medical): Yes    Lack of Transportation (Non-Medical): Patient declined  Physical Activity: Inactive (03/14/2024)   Exercise Vital Sign    Days of Exercise per Week: 0 days    Minutes of Exercise per Session: Not on file  Stress: Stress Concern Present (03/14/2024)   [employer] of Occupational Health - Occupational Stress Questionnaire    Feeling of Stress: Very much  Social Connections: Socially Isolated (03/14/2024)   Social Connection and Isolation Panel    Frequency of Communication with Friends and Family: Once a week    Frequency of Social Gatherings with Friends and Family: Never    Attends Religious Services: Never    Database administrator or Organizations: No    Attends Engineer, structural: Not on file    Marital Status: Married  Catering manager Violence: Not on file     Constitutional: Patient reports chronic fatigue, unintentional weight gain.  Denies fever, malaise, headache.  Respiratory: Denies difficulty breathing, shortness of breath, cough or sputum production.   Cardiovascular: Denies chest pain, chest tightness, palpitations or swelling in the hands or feet.   Musculoskeletal: Patient reports chronic joint, muscle pain, decrease in range of motion, difficulty with gait.  Denies joint swelling.  Skin: Denies redness, rashes, lesions or ulcercations.  Neurological: Patient reports inattention, insomnia, memory, numbness and tingling in her upper and lower extremities.  Denies dizziness, difficulty with speech or problems with balance and coordination.  Psych: Patient has a history of anxiety and depression.  Denies SI/HI.  No other specific complaints in a complete review of systems (except as listed in HPI above).  Observations/Objective:   Wt Readings from Last 3 Encounters:  11/23/23 190 lb 12.8 oz (86.5 kg)  07/03/23 195 lb 9.6 oz (88.7 kg)  06/06/23 196 lb 6.4 oz (89.1 kg)    General: Appears her stated age, obese, in NAD. Pulmonary/Chest: Normal effort. No respiratory distress.  MSK: Unable to perform a full assessment during this video visit Neurological: Alert and oriented. Coordination normal.  Unable to perform sensory testing during this video visit     BMET    Component Value Date/Time   NA 142 11/23/2023 0843   K 5.0 11/23/2023 0843   CL 107 11/23/2023 0843   CO2 30 11/23/2023 0843   GLUCOSE 68 11/23/2023 0843   BUN 16 11/23/2023 0843   CREATININE 1.04 (H) 11/23/2023 0843   CALCIUM 9.2 11/23/2023 0843    Lipid Panel     Component Value Date/Time   CHOL 200 (H) 11/23/2023 0843   TRIG 376 (H) 11/23/2023 0843   HDL 62 11/23/2023 0843   CHOLHDL 3.2 11/23/2023 0843   LDLCALC 88 11/23/2023 0843    CBC    Component Value Date/Time   WBC 7.2 11/23/2023 0843   RBC 5.12 (H) 11/23/2023 0843   HGB 14.6 11/23/2023 0843   HCT 43.9 11/23/2023 0843   PLT 299 11/23/2023 0843   MCV 85.7 11/23/2023 0843   MCH 28.5 11/23/2023 0843   MCHC 33.3 11/23/2023 0843   RDW 13.0 11/23/2023 0843    Hgb A1C Lab Results  Component Value Date   HGBA1C 5.9 (H) 11/23/2023       Assessment and Plan:  Assessment and Plan     Cervical radiculopathy with spinal stenosis Chronic cervical radiculopathy with spinal stenosis causing significant pain and neurological symptoms. MRI shows significant issues with risk  of worsening symptoms and potential loss of arm function if untreated. She is anxious about injections and concerned about adverse outcomes. - Referral to neurosurgeon for further evaluation and management. - Discuss potential for epidural steroid injection, addressing her concerns about sedation and procedure risks. 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 Significant pain causing functional limitations. Physical therapy increased pain. - Continue follow-up with Dr. Fairy Flake at [facility] for [facility]. - Attend scheduled appointment on October 3rd for further evaluation and management. - Increase gabapentin  to 600 mg 3 times daily - Refilled methocarbamol  500 mg at bedtime - Referral to pain management for further evaluation and treatment  Fibromyalgia with chronic pain syndrome Chronic pain syndrome associated with fibromyalgia causing widespread pain. Current management includes gabapentin  and methocarbamol  with limited relief. She is reluctant towards narcotics and concerned about treatment efficacy. - Increase gabapentin  to 600 mg three times a day. - Refill methocarbamol  500 mg at bedtime. - Refer to pain management specialist in [facility] for further evaluation and management.      RTC in 3 months for follow-up of chronic conditions  Follow Up Instructions:    I discussed the assessment and treatment plan with the patient. The patient was provided an opportunity to ask questions and all were answered. The patient agreed with the plan and demonstrated an understanding of the  instructions.   The patient was advised to call back or seek an in-person evaluation if the symptoms worsen or if the condition fails to improve as anticipated.    Angeline Laura, NP

## 2024-03-15 NOTE — Patient Instructions (Signed)
Myofascial Pain Syndrome and Fibromyalgia Myofascial pain syndrome and fibromyalgia are both pain disorders. You may feel this pain mainly in your muscles. Myofascial pain syndrome: Always has tender points in the muscles that will cause pain when pressed (trigger points). The pain may come and go. Usually affects your neck, upper back, and shoulder areas. The pain often moves into your arms and hands. Fibromyalgia: Has muscle pains and tenderness that come and go. Is often associated with tiredness (fatigue) and sleep problems. Has trigger points. Tends to be long-lasting (chronic), but is not life-threatening. Fibromyalgia and myofascial pain syndrome are not the same. However, they often occur together. If you have both conditions, each can make the other worse. Both are common and can cause enough pain and fatigue to make day-to-day activities difficult. Both can be hard to diagnose because their symptoms are common in many other conditions. What are the causes? The exact causes of these conditions are not known. What increases the risk? You are more likely to develop either of these conditions if: You have a family history of the condition. You are female. You have certain triggers, such as: Spine disorders. An injury (trauma) or other physical stressors. Being under a lot of stress. Medical conditions such as osteoarthritis, rheumatoid arthritis, or lupus. What are the signs or symptoms? Fibromyalgia The main symptom of fibromyalgia is widespread pain and tenderness in your muscles. Pain is sometimes described as stabbing, shooting, or burning. You may also have: Tingling or numbness. Sleep problems and fatigue. Problems with attention and concentration (fibro fog). Other symptoms may include: Bowel and bladder problems. Headaches. Vision problems. Sensitivity to odors and noises. Depression or mood changes. Painful menstrual periods (dysmenorrhea). Dry skin or eyes. These  symptoms can vary over time. Myofascial pain syndrome Symptoms of myofascial pain syndrome include: Tight, ropy bands of muscle. Uncomfortable sensations in muscle areas. These may include aching, cramping, burning, numbness, tingling, and weakness. Difficulty moving certain parts of the body freely (poor range of motion). How is this diagnosed? This condition may be diagnosed by your symptoms and medical history. You will also have a physical exam. In general: Fibromyalgia is diagnosed if you have pain, fatigue, and other symptoms for more than 3 months, and symptoms cannot be explained by another condition. Myofascial pain syndrome is diagnosed if you have trigger points in your muscles, and those trigger points are tender and cause pain elsewhere in your body (referred pain). How is this treated? Treatment for these conditions depends on the type that you have. For fibromyalgia, a healthy lifestyle is the most important treatment including aerobic and strength exercises. Different types of medicines are used to help treat pain and include: NSAIDs. Medicines for treating depression. Medicines that help control seizures. Medicines that relax the muscles. Treatment for myofascial pain syndrome includes: Pain medicines, such as NSAIDs. Cooling and stretching of muscles. Massage therapy with myofascial release technique. Trigger point injections. Treating these conditions often requires a team of health care providers. These may include: Your primary care provider. A physical therapist. Complementary health care providers, such as massage therapists or acupuncturists. A psychiatrist for cognitive behavioral therapy. Follow these instructions at home: Medicines Take over-the-counter and prescription medicines only as told by your health care provider. Ask your health care provider if the medicine prescribed to you: Requires you to avoid driving or using machinery. Can cause constipation.  You may need to take these actions to prevent or treat constipation: Drink enough fluid to keep your urine pale   yellow. Take over-the-counter or prescription medicines. Eat foods that are high in fiber, such as beans, whole grains, and fresh fruits and vegetables. Limit foods that are high in fat and processed sugars, such as fried or sweet foods. Lifestyle  Do exercises as told by your health care provider or physical therapist. Practice relaxation techniques to control your stress. You may want to try: Biofeedback. Visual imagery. Hypnosis. Muscle relaxation. Yoga. Meditation. Maintain a healthy lifestyle. This includes eating a healthy diet and getting enough sleep. Do not use any products that contain nicotine or tobacco. These products include cigarettes, chewing tobacco, and vaping devices, such as e-cigarettes. If you need help quitting, ask your health care provider. General instructions Talk to your health care provider about complementary treatments, such as acupuncture or massage. Do not do activities that stress or strain your muscles. This includes repetitive motions and heavy lifting. Keep all follow-up visits. This is important. Where to find support Consider joining a support group with others who are diagnosed with this condition. National Fibromyalgia Association: fmaware.org Where to find more information [place] Pain Foundation: uspainfoundation.org Contact a health care provider if: You have new symptoms. Your symptoms get worse or your pain is severe. You have side effects from your medicines. You have trouble sleeping. Your condition is causing depression or anxiety. Get help right away if: You have thoughts of hurting yourself or others. Get help right away if you feel like you may hurt yourself or others, or have thoughts about taking your own life. Go to your nearest emergency room or: Call 911. Call the National Suicide Prevention Lifeline at 1-800-273-8255  or 988. This is open 24 hours a day. Text the Crisis Text Line at 741741. This information is not intended to replace advice given to you by your health care provider. Make sure you discuss any questions you have with your health care provider. Document Revised: 04/11/2022 Document Reviewed: 06/04/2021 Elsevier Patient Education  2024 [employer].  

## 2024-03-15 NOTE — Telephone Encounter (Signed)
Appointment has already been completed. 

## 2024-03-15 NOTE — Addendum Note (Signed)
 Addended by: ANTONETTE ANGELINE ORN on: 03/15/2024 10:57 AM   Modules accepted: Orders

## 2024-03-20 DIAGNOSIS — F4310 Post-traumatic stress disorder, unspecified: Secondary | ICD-10-CM | POA: Diagnosis not present

## 2024-04-04 DIAGNOSIS — F4310 Post-traumatic stress disorder, unspecified: Secondary | ICD-10-CM | POA: Diagnosis not present

## 2024-04-09 DIAGNOSIS — F411 Generalized anxiety disorder: Secondary | ICD-10-CM | POA: Diagnosis not present

## 2024-04-09 DIAGNOSIS — F4310 Post-traumatic stress disorder, unspecified: Secondary | ICD-10-CM | POA: Diagnosis not present

## 2024-04-09 DIAGNOSIS — F909 Attention-deficit hyperactivity disorder, unspecified type: Secondary | ICD-10-CM | POA: Diagnosis not present

## 2024-04-09 DIAGNOSIS — F329 Major depressive disorder, single episode, unspecified: Secondary | ICD-10-CM | POA: Diagnosis not present

## 2024-04-11 DIAGNOSIS — F4310 Post-traumatic stress disorder, unspecified: Secondary | ICD-10-CM | POA: Diagnosis not present

## 2024-04-18 DIAGNOSIS — F4310 Post-traumatic stress disorder, unspecified: Secondary | ICD-10-CM | POA: Diagnosis not present

## 2024-04-25 DIAGNOSIS — F4310 Post-traumatic stress disorder, unspecified: Secondary | ICD-10-CM | POA: Diagnosis not present

## 2024-04-29 NOTE — Progress Notes (Unsigned)
 Referring Physician:  Antonette Angeline ORN, NP [address] [place],  [place] [address]  Primary Physician:  Antonette Angeline ORN, NP  History of Present Illness: 05/08/2024 Ms. Sara Andrade has a history of HTN, GERD, ADHD, PTSD, FM, obesity, prediabetes, hyperlipidemia, chronic pain, depression, OSA.   Has seen PMR at [facility] in the past- cervical ESI ordered with IR and never done.   She has constant neck pain into her shoulder blades and upper mid back and into her both arms. She has numbness and tingling in both arms. She has weakness in hands and is dropping things, right > left. She notes balance and dexterity issues as well.  No alleviating factors. She is worse when in any prolonged position, lifting.   She has chronic constant LBP with constant right lateral leg pain to her knee. No left leg pain. She has occasional numbness and tingling in her feet. No weakness. Pain is worse with standing, walking, sitting, and lifting.   She also has right hip labral tear- seeing ortho today.   EMG 11/02/2022 showed abnormal study. There is no electrodiagnostic evidence of a large fiber neuropathy in the upper extremities.   PCP recently increased neurontin  to 600mg  tid and she is on robaxin  at night. She is on prn motrin .   Tobacco use: Does not smoke.   Bowel/Bladder Dysfunction: she has chronic urinary incontinence. She notes 1 month history of some bowel incontinence as well. No perineal numbness.  Conservative measures:  Physical therapy: has not participated in for her neck pain Multimodal medical therapy including regular antiinflammatories:  Gabapentin , Robaxin , Flexeril  Injections:   no epidural steroid injections  Past Surgery: no spine surgery  Sara Andrade has more recent symptoms of balance and dexterity issues.   The symptoms are causing a significant impact on the patient's life.   Review of Systems:  A 10 point review of systems is negative, except for the pertinent positives and  negatives detailed in the HPI.  Past Medical History: Past Medical History:  Diagnosis Date   Agoraphobia with panic disorder 03/21/2020   Have not left the house alone or driven since 2021     Allergy    Anxiety    Complication of anesthesia    Depression    GERD (gastroesophageal reflux disease)    Heart murmur    Hypertension    Neuromuscular disorder (HCC) 2021   Fibromyalgia   Sleep apnea     Past Surgical History: Past Surgical History:  Procedure Laterality Date   ABDOMINAL HYSTERECTOMY     COLONOSCOPY WITH PROPOFOL  N/A 06/01/2023   Procedure: COLONOSCOPY WITH PROPOFOL ;  Surgeon: Therisa Bi, MD;  Location: [facility] ENDOSCOPY;  Service: Gastroenterology;  Laterality: N/A;   POLYPECTOMY  06/01/2023   Procedure: POLYPECTOMY;  Surgeon: Therisa Bi, MD;  Location: [facility] ENDOSCOPY;  Service: Gastroenterology;;    Allergies: Allergies as of 05/08/2024 - Review Complete 05/08/2024  Allergen Reaction Noted   Bee venom Rash 01/21/2021   Escitalopram Other (See Comments) 09/22/2021   Lactose Diarrhea 06/04/2015   Losartan Other (See Comments) 04/16/2024   Prednisone  12/25/2020   Sertraline Diarrhea 08/25/2015   Lisinopril Cough and Other (See Comments) 05/22/2015    Medications: Outpatient Encounter Medications as of 05/08/2024  Medication Sig   dicyclomine  (BENTYL ) 10 MG capsule Take 1 capsule (10 mg total) by mouth 3 (three) times daily before meals.   felodipine  (PLENDIL ) 5 MG 24 hr tablet TAKE 1 TABLET (5 MG TOTAL) BY MOUTH DAILY.  fluvoxaMINE (LUVOX) 100 MG tablet Take 300 mg by mouth at bedtime.   gabapentin  (NEURONTIN ) 600 MG tablet Take 1 tablet (600 mg total) by mouth 3 (three) times daily.   ibuprofen  (ADVIL ) 600 MG tablet TAKE 1 TABLET BY MOUTH EVERY 8 HOURS AS NEEDED   lamoTRIgine (LAMICTAL) 100 MG tablet Take 200 mg by mouth daily.   lovastatin  (MEVACOR ) 20 MG tablet Take 1 tablet (20 mg total) by mouth at bedtime.   methocarbamol  (ROBAXIN ) 500 MG tablet Take  1 tablet (500 mg total) by mouth at bedtime as needed.   omeprazole  (PRILOSEC) 40 MG capsule TAKE 1 CAPSULE BY MOUTH EVERY DAY BEFORE BREAKFAST   prazosin (MINIPRESS) 5 MG capsule Take 5 mg by mouth at bedtime.   QELBREE 200 MG 24 hr capsule Take 400 mg by mouth daily.   REXULTI 2 MG TABS tablet Take 4 mg by mouth daily.   [DISCONTINUED] olmesartan  (BENICAR ) 40 MG tablet Take 1 tablet (40 mg total) by mouth daily.   No facility-administered encounter medications on file as of 05/08/2024.    Social History: Social History   Tobacco Use   Smoking status: Former    Current packs/day: 0.00    Average packs/day: 0.3 packs/day for 3.0 years (0.8 ttl pk-yrs)    Types: Cigarettes    Start date: 2009    Quit date: 2012    Years since quitting: 13.8   Smokeless tobacco: Never   Tobacco comments:    Used to socially smoke  Vaping Use   Vaping status: Never Used  Substance Use Topics   Alcohol use: Yes    Comment: occ   Drug use: Never    Family Medical History: Family History  Adopted: Yes    Physical Examination: Vitals:   05/08/24 0852 05/08/24 0923  BP: (!) 124/96 (!) 120/92    General: Patient is well developed, well nourished, calm, collected, and in no apparent distress. Attention to examination is appropriate.  Respiratory: Patient is breathing without any difficulty.   NEUROLOGICAL:     Awake, alert, oriented to person, place, and time.  Speech is clear and fluent. Fund of knowledge is appropriate.   Cranial Nerves: Pupils equal round and reactive to light.  Facial tone is symmetric.    diffuse posterior cervical tenderness. mild tenderness in bilateral trapezial region.  No abnormal lesions on exposed skin.   Strength: Side Biceps Triceps Deltoid Interossei Grip Wrist Ext. Wrist Flex.  R 5 5 5 5 5 5 5   L 5 5 5 5 5 5 5    Side Iliopsoas Quads Hamstring PF DF EHL  R 5 5 5 5 5 5   L 5 5 5 5 5 5    Reflexes are 3+ and symmetric at the biceps, brachioradialis,  patella and achilles.   Hoffman's is absent.  Clonus is not present.   Bilateral upper and lower extremity sensation is intact to light touch.     She has mild pain with IR/ER of right hip. No pain with IR/ER left hip.   Gait is normal.      Medical Decision Making  Imaging: Cervical MRI dated 07/13/22:  FINDINGS: Alignment: There is straightening of the normal cervical lordosis. Minimal kyphotic angulation centered at C5. No sagittal spondylolisthesis.   Vertebrae: Vertebral body heights are maintained. Mild posterior C6-7 disc space narrowing. Decreased disc hydration at C2-3 through C6-7. Mild right greater than left C5-6 and C6-7 chronic fat intensity marrow endplate degenerative changes. No acute fracture.  Cord: Normal signal and morphology.   Posterior Fossa, vertebral arteries, paraspinal tissues: Negative   Disc levels:   C2-3: Mild bilateral facet joint hypertrophy. No posterior disc bulge, central canal narrowing, or neuroforaminal stenosis.   C3-4: Mild bilateral facet joint hypertrophy. Mild broad-based posterior disc bulge. No central canal or neuroforaminal stenosis.   C4-5: Mild-to-moderate bilateral facet joint hypertrophy. Mild right-greater-than-left posterior disc bulge minimally impresses on the right ventral cord. Right intraforaminal disc and endplate spurring contribute to mild-to-moderate right neuroforaminal stenosis. Mild-to-moderate narrowing of the right lateral recess. No central canal stenosis.   C5-6: Mild-to-moderate bilateral facet joint hypertrophy. Moderate left greater than right posterior disc osteophyte complex minimally impresses on the ventral cord. Mild central canal stenosis. Moderate narrowing of the lateral recesses. Moderate to severe left and moderate right neuroforaminal stenosis.   C6-7: Moderate right-greater-than-left posterior disc osteophyte complex. Mild-to-moderate bilateral facet joint hypertrophy.  Disc minimally impresses on the right ventral cord and mildly contacts the left ventral cord. Severe right and mild left lateral recess stenosis. Mild central canal stenosis. Right greater than left intraforaminal disc extension with moderate to severe right and mild-to-moderate left neuroforaminal stenosis.   C7-T1: No posterior disc bulge, central canal narrowing, or neuroforaminal stenosis.   T1-T2: Seen on sagittal images only. Right facet joint hypertrophy and uncovertebral hypertrophy contribute to mild right neuroforaminal stenosis.   IMPRESSION: 1. Multilevel degenerative disc and joint changes as above. 2. Mild C5-6 and C6-7 central canal stenosis. 3. Multilevel neuroforaminal stenosis including mild-to-moderate right C4-5, moderate to severe left and moderate right C5-6, and moderate to severe right and mild-to-moderate left C6-7 neuroforaminal stenosis.     Electronically Signed   By: Tanda Lyons M.D.   On: 07/13/2022 10:41   Lumbar MRI dated 01/07/24:  FINDINGS: Segmentation:  Standard.   Alignment:  Physiologic.   Vertebrae:  No fracture, evidence of discitis, or bone lesion.   Conus medullaris and cauda equina: Conus extends to the L1 level. Conus and cauda equina appear normal.   Paraspinal and other soft tissues: Negative.   Disc levels:   L1-L2: Normal disc space and facet joints. No spinal canal stenosis. No neural foraminal stenosis.   L2-L3: Disc desiccation without herniation. No spinal canal stenosis. No neural foraminal stenosis.   L3-L4: Disc desiccation without herniation. No spinal canal stenosis. No neural foraminal stenosis.   L4-L5: Small left asymmetric disc bulge. No spinal canal stenosis. Left neural foraminal stenosis.   L5-S1: Normal disc space and facet joints. No spinal canal stenosis. No neural foraminal stenosis.   Visualized sacrum: Normal.   IMPRESSION: 1. Mild left L4-5 neural foraminal stenosis secondary to  left asymmetric disc bulge. 2. Otherwise mild degenerative disc disease without spinal canal or neural foraminal stenosis.     Electronically Signed   By: Franky Stanford M.D.   On: 01/14/2023 18:10   I have personally reviewed the images and agree with the above interpretation.  Assessment and Plan: Ms. Siebers has chronic constant neck pain into her shoulder blades and upper mid back and into her both arms. She has numbness and tingling in both arms. She has weakness in hands and is dropping things, right > left. She notes balance and dexterity issues as well.    MRI from 2023 showed cervical spondylosis with mild central stenosis C5-C7 and multilevel foraminal stenosis.   She is hyper reflexic on exam and has dexterity and balance issues.   She also has chronic constant LBP with constant right lateral  leg pain to her knee. No left leg pain. She has occasional numbness and tingling in her feet. No weakness.   MRI from 2024 shows lumbar spondylosis with mild left foraminal stenosis at L4-L5.   Treatment options discussed with patient and following plan made:   - MRI of cervical spine to evaluate chronic neck pain and hyper reflexia along with dexterity and balance issues.  - MRI of thoracic spine to evaluate hyper reflexia, dexterity/balance issues.  - MRI of lumbar spine to further evaluate LBP and right leg pain.  - Discussed that bowel issues are not likely lumbar mediated. Will f/u with GI about this.  - Will schedule follow up visit to review MRI results once I get them back.   BP was slightly elevated. No symptoms of chest pain, shortness of breath, blurry vision, or headaches. Will recheck at home and call PCP if not improved. If she develops CP, SOB, blurry vision, or headaches, then she will go to ED.     I spent a total of 30 minutes in face-to-face and non-face-to-face activities related to this patient's care today including review of outside records, review of imaging,  review of symptoms, physical exam, discussion of differential diagnosis, discussion of treatment options, and documentation.   Thank you for involving me in the care of this patient.   Glade Boys PA-C Dept. of Neurosurgery

## 2024-05-02 DIAGNOSIS — F4310 Post-traumatic stress disorder, unspecified: Secondary | ICD-10-CM | POA: Diagnosis not present

## 2024-05-06 DIAGNOSIS — F329 Major depressive disorder, single episode, unspecified: Secondary | ICD-10-CM | POA: Diagnosis not present

## 2024-05-06 DIAGNOSIS — F411 Generalized anxiety disorder: Secondary | ICD-10-CM | POA: Diagnosis not present

## 2024-05-06 DIAGNOSIS — F4310 Post-traumatic stress disorder, unspecified: Secondary | ICD-10-CM | POA: Diagnosis not present

## 2024-05-06 DIAGNOSIS — F909 Attention-deficit hyperactivity disorder, unspecified type: Secondary | ICD-10-CM | POA: Diagnosis not present

## 2024-05-08 ENCOUNTER — Encounter: Payer: Self-pay | Admitting: Orthopedic Surgery

## 2024-05-08 ENCOUNTER — Ambulatory Visit: Admitting: Orthopedic Surgery

## 2024-05-08 VITALS — BP 120/92 | Ht 62.0 in | Wt 206.0 lb

## 2024-05-08 DIAGNOSIS — M4726 Other spondylosis with radiculopathy, lumbar region: Secondary | ICD-10-CM | POA: Diagnosis not present

## 2024-05-08 DIAGNOSIS — M24159 Other articular cartilage disorders, unspecified hip: Secondary | ICD-10-CM | POA: Diagnosis not present

## 2024-05-08 DIAGNOSIS — M4802 Spinal stenosis, cervical region: Secondary | ICD-10-CM

## 2024-05-08 DIAGNOSIS — R2689 Other abnormalities of gait and mobility: Secondary | ICD-10-CM | POA: Diagnosis not present

## 2024-05-08 DIAGNOSIS — M542 Cervicalgia: Secondary | ICD-10-CM

## 2024-05-08 DIAGNOSIS — M25851 Other specified joint disorders, right hip: Secondary | ICD-10-CM | POA: Diagnosis not present

## 2024-05-08 DIAGNOSIS — M47812 Spondylosis without myelopathy or radiculopathy, cervical region: Secondary | ICD-10-CM

## 2024-05-08 DIAGNOSIS — M5412 Radiculopathy, cervical region: Secondary | ICD-10-CM

## 2024-05-08 DIAGNOSIS — M48061 Spinal stenosis, lumbar region without neurogenic claudication: Secondary | ICD-10-CM | POA: Diagnosis not present

## 2024-05-08 DIAGNOSIS — M5416 Radiculopathy, lumbar region: Secondary | ICD-10-CM

## 2024-05-08 DIAGNOSIS — R292 Abnormal reflex: Secondary | ICD-10-CM | POA: Diagnosis not present

## 2024-05-08 DIAGNOSIS — M47816 Spondylosis without myelopathy or radiculopathy, lumbar region: Secondary | ICD-10-CM

## 2024-05-08 DIAGNOSIS — M4722 Other spondylosis with radiculopathy, cervical region: Secondary | ICD-10-CM | POA: Diagnosis not present

## 2024-05-08 DIAGNOSIS — M9943 Connective tissue stenosis of neural canal of lumbar region: Secondary | ICD-10-CM | POA: Diagnosis not present

## 2024-05-08 DIAGNOSIS — G8929 Other chronic pain: Secondary | ICD-10-CM | POA: Diagnosis not present

## 2024-05-08 DIAGNOSIS — M76891 Other specified enthesopathies of right lower limb, excluding foot: Secondary | ICD-10-CM | POA: Diagnosis not present

## 2024-05-08 NOTE — Patient Instructions (Signed)
 It was so nice to see you today. Thank you so much for coming in.    I want to get an MRI of your neck, mid back, and lower back to look into things further. We will get this approved through your insurance and [facility] will call you to schedule the appointment. Ask about your patient responsibility. You do not need to pay this prior to getting MRI, they can bill you.     [facility] is located at [address] in [place]. This is near the intersection of [address] and [address].   After you have the MRI, it can take 14-28 days for me to get the results back. If I don't have them in 2 weeks, we will call to try to get the results.   Once I have the results, we will call you to schedule a follow up visit with me to review them.   Your blood pressure was elevated today (bottom number). I want you to recheck it at home and follow up with your PCP if it remains high. If you have any chest pain, shortness of breath, blurry vision, or headaches then you need to go to ED.    Please do not hesitate to call if you have any questions or concerns. You can also message me in MyChart.   Glade Boys PA-C [phone]     The physicians and staff at [facility] are committed to providing excellent care. You may receive a survey asking for feedback about your experience at our office. We value you your feedback and appreciate you taking the time to to fill it out. The [facility] leadership team is also available to discuss your experience in person, feel free to contact us  [phone].

## 2024-05-09 DIAGNOSIS — F4310 Post-traumatic stress disorder, unspecified: Secondary | ICD-10-CM | POA: Diagnosis not present

## 2024-05-10 ENCOUNTER — Other Ambulatory Visit: Payer: Self-pay | Admitting: Internal Medicine

## 2024-05-10 DIAGNOSIS — E782 Mixed hyperlipidemia: Secondary | ICD-10-CM

## 2024-05-11 NOTE — Telephone Encounter (Signed)
 Lovastatin  discontinued 11/24/23.  Requested Prescriptions  Pending Prescriptions Disp Refills   lovastatin  (MEVACOR ) 10 MG tablet [Pharmacy Med Name: LOVASTATIN  10 MG TABLET] 30 tablet 11    Sig: TAKE 1 TABLET BY MOUTH EVERYDAY AT BEDTIME     Cardiovascular:  Antilipid - Statins 2 Failed - 05/11/2024  8:53 AM      Failed - Cr in normal range and within 360 days    Creat  Date Value Ref Range Status  11/23/2023 1.04 (H) 0.50 - 0.99 mg/dL Final         Failed - Lipid Panel in normal range within the last 12 months    Cholesterol  Date Value Ref Range Status  11/23/2023 200 (H) <200 mg/dL Final   LDL Cholesterol (Calc)  Date Value Ref Range Status  11/23/2023 88 mg/dL (calc) Final    Comment:    Reference range: <100 . Desirable range <100 mg/dL for primary prevention;   <70 mg/dL for patients with CHD or diabetic patients  with > or = 2 CHD risk factors. [facility]ABRA LDL-C is now calculated using the Martin-Hopkins  calculation, which is a validated novel method providing  better accuracy than the Friedewald equation in the  estimation of LDL-C.  Gladis APPLETHWAITE et al. SANDREA. 7986;689(80): 2061-2068  (http://education.Quest[employer].com/faq/FAQ164)    HDL  Date Value Ref Range Status  11/23/2023 62 > OR = 50 mg/dL Final   Triglycerides  Date Value Ref Range Status  11/23/2023 376 (H) <150 mg/dL Final    Comment:    . If a non-fasting specimen was collected, consider repeat triglyceride testing on a fasting specimen if clinically indicated.  Veatrice et al. J. of Clin. Lipidol. 2015;9:129-169. SABRA          Passed - Patient is not pregnant      Passed - Valid encounter within last 12 months    Recent Outpatient Visits           1 month ago Cervical radiculitis   [facility] [place], Angeline ORN, NP   5 months ago Encounter for general adult medical examination with abnormal findings   [facility] [place], Angeline ORN, NP                felodipine  (PLENDIL ) 5 MG 24 hr tablet [Pharmacy Med Name: FELODIPINE  ER 5 MG TABLET] 90 tablet 0    Sig: TAKE 1 TABLET (5 MG TOTAL) BY MOUTH DAILY.     Cardiovascular: Calcium Channel Blockers 2 Failed - 05/11/2024  8:53 AM      Failed - Last BP in normal range    BP Readings from Last 1 Encounters:  05/08/24 (!) 120/92         Passed - Last Heart Rate in normal range    Pulse Readings from Last 1 Encounters:  07/03/23 86         Passed - Valid encounter within last 6 months    Recent Outpatient Visits           1 month ago Cervical radiculitis   [place] [place], Angeline ORN, NP   5 months ago Encounter for general adult medical examination with abnormal findings   [place] [facility] [place], Angeline ORN, NP

## 2024-05-23 DIAGNOSIS — F4310 Post-traumatic stress disorder, unspecified: Secondary | ICD-10-CM | POA: Diagnosis not present

## 2024-05-28 ENCOUNTER — Ambulatory Visit: Admitting: Internal Medicine

## 2024-05-28 ENCOUNTER — Encounter: Payer: Self-pay | Admitting: Orthopedic Surgery

## 2024-05-30 ENCOUNTER — Encounter: Payer: Self-pay | Admitting: Orthopedic Surgery

## 2024-06-03 ENCOUNTER — Ambulatory Visit: Admitting: Internal Medicine

## 2024-06-05 ENCOUNTER — Telehealth: Payer: Self-pay

## 2024-06-05 DIAGNOSIS — M5412 Radiculopathy, cervical region: Secondary | ICD-10-CM

## 2024-06-05 NOTE — Progress Notes (Signed)
 Complex Care Management Note Care Guide Note  06/05/2024 Name: Sara Andrade MRN: [id number] DOB: July 04, 1981   Complex Care Management Outreach Attempts: An unsuccessful telephone outreach was attempted today to offer the patient information about available complex care management services.  Follow Up Plan:  No further outreach attempts will be made at this time. We have been unable to contact the patient to offer or enroll patient in complex care management services.  Encounter Outcome:  No Answer  Dreama [facility]  [facility], [facility] VBCI Assistant Direct Dial: [phone]  Fax: [phone]

## 2024-06-06 DIAGNOSIS — F4310 Post-traumatic stress disorder, unspecified: Secondary | ICD-10-CM | POA: Diagnosis not present

## 2024-06-07 NOTE — Progress Notes (Unsigned)
 Complex Care Management Note Care Guide Note  06/07/2024 Name: Sara Andrade MRN: [id number] DOB: Sep 24, 1980   Complex Care Management Outreach Attempts: A second unsuccessful outreach was attempted today to offer the patient with information about available complex care management services.  Follow Up Plan:  Additional outreach attempts will be made to offer the patient complex care management information and services.   Encounter Outcome:  No Answer  Dreama [facility]  [facility], [facility] VBCI Assistant Direct Dial: [phone]  Fax: [phone]

## 2024-06-08 ENCOUNTER — Other Ambulatory Visit: Payer: Self-pay | Admitting: Internal Medicine

## 2024-06-08 ENCOUNTER — Ambulatory Visit
Admission: RE | Admit: 2024-06-08 | Discharge: 2024-06-08 | Disposition: A | Discharge status: Home / Self Care | Source: Ambulatory Visit | Attending: Orthopedic Surgery | Admitting: Orthopedic Surgery

## 2024-06-08 ENCOUNTER — Ambulatory Visit
Admission: RE | Admit: 2024-06-08 | Discharge: 2024-06-08 | Disposition: A | Source: Ambulatory Visit | Attending: Orthopedic Surgery | Admitting: Orthopedic Surgery

## 2024-06-08 DIAGNOSIS — M47812 Spondylosis without myelopathy or radiculopathy, cervical region: Secondary | ICD-10-CM

## 2024-06-08 DIAGNOSIS — R292 Abnormal reflex: Secondary | ICD-10-CM

## 2024-06-08 DIAGNOSIS — M5126 Other intervertebral disc displacement, lumbar region: Secondary | ICD-10-CM | POA: Diagnosis not present

## 2024-06-08 DIAGNOSIS — M5412 Radiculopathy, cervical region: Secondary | ICD-10-CM

## 2024-06-08 DIAGNOSIS — M47816 Spondylosis without myelopathy or radiculopathy, lumbar region: Secondary | ICD-10-CM

## 2024-06-08 DIAGNOSIS — M5416 Radiculopathy, lumbar region: Secondary | ICD-10-CM

## 2024-06-08 DIAGNOSIS — M5020 Other cervical disc displacement, unspecified cervical region: Secondary | ICD-10-CM | POA: Diagnosis not present

## 2024-06-08 DIAGNOSIS — M4802 Spinal stenosis, cervical region: Secondary | ICD-10-CM | POA: Diagnosis not present

## 2024-06-08 DIAGNOSIS — M542 Cervicalgia: Secondary | ICD-10-CM

## 2024-06-08 DIAGNOSIS — M546 Pain in thoracic spine: Secondary | ICD-10-CM | POA: Diagnosis not present

## 2024-06-08 DIAGNOSIS — G8929 Other chronic pain: Secondary | ICD-10-CM

## 2024-06-10 ENCOUNTER — Other Ambulatory Visit: Payer: Self-pay | Admitting: Internal Medicine

## 2024-06-10 NOTE — Telephone Encounter (Signed)
 Requested Prescriptions  Pending Prescriptions Disp Refills   gabapentin  (NEURONTIN ) 600 MG tablet [Pharmacy Med Name: GABAPENTIN  600 MG TABLET] 90 tablet 2    Sig: TAKE 1 TABLET BY MOUTH THREE TIMES A DAY     Neurology: Anticonvulsants - gabapentin  Failed - 06/10/2024  2:09 PM      Failed - Cr in normal range and within 360 days    Creat  Date Value Ref Range Status  11/23/2023 1.04 (H) 0.50 - 0.99 mg/dL Final         Passed - Completed PHQ-2 or PHQ-9 in the last 360 days      Passed - Valid encounter within last 12 months    Recent Outpatient Visits           2 months ago Cervical radiculitis   [facility] [place], Angeline ORN, NP   6 months ago Encounter for general adult medical examination with abnormal findings   [place] [place], Angeline ORN, NP

## 2024-06-10 NOTE — Progress Notes (Signed)
 Complex Care Management Note Care Guide Note  06/10/2024 Name: Sara Andrade MRN: [id number] DOB: [birth year]   Complex Care Management Outreach Attempts: A third unsuccessful outreach was attempted today to offer the patient with information about available complex care management services.  Follow Up Plan:  No further outreach attempts will be made at this time. We have been unable to contact the patient to offer or enroll patient in complex care management services.  Encounter Outcome:  No Answer  Dreama [facility]  [facility], [facility] VBCI Assistant Direct Dial: [phone]  Fax: [phone]

## 2024-06-11 NOTE — Telephone Encounter (Signed)
 Requested medications are due for refill today.  yes  Requested medications are on the active medications list.  yes  Last refill. 03/15/2024 #90 0 rf  Future visit scheduled.   yes  Notes to clinic.  Refill not delegated.    Requested Prescriptions  Pending Prescriptions Disp Refills   methocarbamol  (ROBAXIN ) 500 MG tablet [Pharmacy Med Name: METHOCARBAMOL  500 MG TABLET] 30 tablet 2    Sig: Take 1 tablet (500 mg total) by mouth at bedtime as needed.     Not Delegated - Analgesics:  Muscle Relaxants Failed - 06/11/2024  9:58 AM      Failed - This refill cannot be delegated      Failed - Valid encounter within last 6 months    Recent Outpatient Visits           2 months ago Cervical radiculitis   [place] [facility] [place], Angeline ORN, NP   6 months ago Encounter for general adult medical examination with abnormal findings   [facility] [place], Angeline ORN, NP

## 2024-06-18 DIAGNOSIS — F4310 Post-traumatic stress disorder, unspecified: Secondary | ICD-10-CM | POA: Diagnosis not present

## 2024-06-18 DIAGNOSIS — F411 Generalized anxiety disorder: Secondary | ICD-10-CM | POA: Diagnosis not present

## 2024-06-18 DIAGNOSIS — F329 Major depressive disorder, single episode, unspecified: Secondary | ICD-10-CM | POA: Diagnosis not present

## 2024-06-18 DIAGNOSIS — F909 Attention-deficit hyperactivity disorder, unspecified type: Secondary | ICD-10-CM | POA: Diagnosis not present

## 2024-06-20 DIAGNOSIS — F4310 Post-traumatic stress disorder, unspecified: Secondary | ICD-10-CM | POA: Diagnosis not present

## 2024-06-21 ENCOUNTER — Ambulatory Visit: Admitting: Internal Medicine

## 2024-06-21 NOTE — Progress Notes (Deleted)
 Subjective:    Patient ID: Sara Andrade, female    DOB: Nov 30, 1980, 43 y.o.   MRN: [id number]  HPI  Patient presents the clinic today for follow-up of chronic conditions.  HTN: Her BP today is 148/108. Her BP at home runs 135/87. She is taking felodipine  as prescribed.  There is no ECG on file.  ADHD: She reports mainly innattention.  She is taking qelbree as prescribed.  She follows with psychiatry.  Anxiety, depression and PTSD: Chronic, managed on lamotrigine, prazosin, fluvoxamine and rexulti.  She is no longer taking diazepam or clonazepam.  She is currently seeing a therapist and a psychiatrist.  She denies SI/HI.  Fibromyalgia: She reports chronic joint and muscle pain, worse in her back. She is taking gabapentin  and methocarbamol  with some relief of symptoms.  She follows with physiatry.  Chronic diarrhea: She is not currently taking any medications for this.  She has been prescribed dicyclomine  in the past.  She follows with GI.  GERD: Triggered by spicy and greasy foods.  She denies breakthrough on omeprazole .  There is no upper GI on file.  Prediabetes: Her last A1c was 5.9%, 11/2023.  She is not taking any oral diabetic medication at this time.  She does not check her sugars.  HLD: Her last LDL was 88, triglycerides 623, 11/2023.  She denies myalgias on lovastatin .  She does not consume a low-fat diet.  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 90 capsule 5    felodipine  (PLENDIL ) 5 MG 24 hr tablet TAKE 1 TABLET (5 MG TOTAL) BY MOUTH DAILY. 90 tablet 0   fluvoxaMINE (LUVOX) 100 MG tablet Take 300 mg by mouth at bedtime.     gabapentin  (NEURONTIN ) 600 MG tablet TAKE 1 TABLET BY MOUTH THREE TIMES A DAY 90 tablet 1   ibuprofen  (ADVIL ) 600 MG tablet TAKE 1 TABLET BY MOUTH EVERY 8 HOURS AS NEEDED 90 tablet 0   lamoTRIgine (LAMICTAL) 100 MG tablet Take 200 mg by mouth daily.     lovastatin  (MEVACOR ) 20 MG tablet Take 1 tablet (20 mg total) by mouth at bedtime. 90 tablet 1   methocarbamol  (ROBAXIN ) 500 MG tablet TAKE 1 TABLET (500 MG TOTAL) BY MOUTH AT BEDTIME AS NEEDED. 30 tablet 2   omeprazole  (PRILOSEC) 40 MG capsule TAKE 1 CAPSULE BY MOUTH EVERY DAY BEFORE BREAKFAST 30 capsule 2   prazosin (MINIPRESS) 5 MG capsule Take 5 mg by mouth at bedtime.     QELBREE 200 MG 24 hr capsule Take 400 mg by mouth daily.     REXULTI 2 MG TABS tablet Take 4 mg by mouth daily.     No current facility-administered medications for this visit.    Allergies  Allergen Reactions   Bee Venom Rash   Escitalopram Other (See Comments)   Lactose Diarrhea    Gas    Losartan Other (  See Comments)   Prednisone     Other reaction(s): Unknown   Sertraline Diarrhea    Other reaction(s): Other (See Comments), Other (See Comments) Emotional numbness Emotional numbness    Lisinopril Cough and Other (See Comments)    cough cough     Family History  Adopted: Yes    Social History   Socioeconomic History   Marital status: Married    Spouse name: Not on file   Number of children: Not on file   Years of education: Not on file   Highest education level: Associate degree: occupational, scientist, product/process development, or vocational program  Occupational History   Not on file  Tobacco Use   Smoking status: Former    Current packs/day: 0.00    Average packs/day: 0.3 packs/day for 3.0 years (0.8 ttl pk-yrs)    Types: Cigarettes    Start date: 2009    Quit date: 2012    Years since  quitting: 13.9   Smokeless tobacco: Never   Tobacco comments:    Used to socially smoke  Vaping Use   Vaping status: Never Used  Substance and Sexual Activity   Alcohol use: Yes    Comment: occ   Drug use: Never   Sexual activity: Yes    Comment: I'm gay so nah  Other Topics Concern   Not on file  Social History Narrative   Not on file   Social Drivers of Health   Financial Resource Strain: High Risk (06/17/2024)   Overall Financial Resource Strain (CARDIA)    Difficulty of Paying Living Expenses: Very hard  Food Insecurity: Patient Declined (06/17/2024)   Hunger Vital Sign    Worried About Running Out of Food in the Last Year: Patient declined    Ran Out of Food in the Last Year: Patient declined  Transportation Needs: Unmet Transportation Needs (06/17/2024)   PRAPARE - Administrator, Civil Service (Medical): Yes    Lack of Transportation (Non-Medical): Yes  Physical Activity: Inactive (06/17/2024)   Exercise Vital Sign    Days of Exercise per Week: 0 days    Minutes of Exercise per Session: Not on file  Stress: Stress Concern Present (06/17/2024)   [employer] of Occupational Health - Occupational Stress Questionnaire    Feeling of Stress: Very much  Social Connections: Socially Isolated (06/17/2024)   Social Connection and Isolation Panel    Frequency of Communication with Friends and Family: Never    Frequency of Social Gatherings with Friends and Family: Never    Attends Religious Services: Never    Database Administrator or Organizations: No    Attends Engineer, Structural: Not on file    Marital Status: Married  Catering Manager Violence: Not on file     Constitutional: Denies fever, malaise, fatigue, headache or abrupt weight changes.  HEENT: Denies eye pain, eye redness, ear pain, ringing in the ears, wax buildup, runny nose, nasal congestion, bloody nose, or sore throat. Respiratory: Denies difficulty breathing, shortness of breath,  cough or sputum production.   Cardiovascular: Denies chest pain, chest tightness, palpitations or swelling in the hands or feet.  Gastrointestinal: Patient reports intermittent reflux, chronic diarrhea.  Denies abdominal pain, bloating, constipation, or blood in the stool.  GU: Denies urgency, frequency, pain with urination, burning sensation, blood in urine, odor or discharge. Musculoskeletal: Patient reports chronic joint and muscle pain, worse in her neck and back.  Denies decrease in range of motion, difficulty with gait, or joint swelling.  Skin: Denies redness, rashes, lesions or ulcercations.  Neurological: Patient reports inattention, paresthesia of upper and lower extremities.  Denies dizziness, difficulty with memory, difficulty with speech or problems with balance and coordination.  Psych: Patient has a history of anxiety and depression.  Denies SI/HI.  No other specific complaints in a complete review of systems (except as listed in HPI above).  Objective:   Physical Exam   There were no vitals taken for this visit.  Wt Readings from Last 3 Encounters:  05/08/24 206 lb (93.4 kg)  11/23/23 190 lb 12.8 oz (86.5 kg)  07/03/23 195 lb 9.6 oz (88.7 kg)    General: Appears her stated age, obese, in NAD. Skin: Warm, dry and intact.  Indentions noted in her shoulders from bra. HEENT: Head: normal shape and size; Eyes: sclera white, no icterus, conjunctiva pink, PERRLA and EOMs intact;  Cardiovascular: Normal rate and rhythm. S1,S2 noted.  No murmur, rubs or gallops noted. No JVD or BLE edema.  Pulmonary/Chest: Normal effort and positive vesicular breath sounds. No respiratory distress. No wheezes, rales or ronchi noted.  Abdomen: Soft and nontender.  Hyperactive bowel sounds.  Musculoskeletal: Bony tenderness noted throughout the cervical, thoracic and lumbar spine pain with palpation of bilateral paralumbar muscles.  Strength 5/5 BUE, 4/5 BLE.  No difficulty with gait.   Neurological: Alert and oriented. Cranial nerves II-XII grossly intact. Coordination normal.  Psychiatric: Mood and affect normal.  Very anxious appearing. Judgment and thought content normal.    BMET    Component Value Date/Time   NA 142 11/23/2023 0843   K 5.0 11/23/2023 0843   CL 107 11/23/2023 0843   CO2 30 11/23/2023 0843   GLUCOSE 68 11/23/2023 0843   BUN 16 11/23/2023 0843   CREATININE 1.04 (H) 11/23/2023 0843   CALCIUM 9.2 11/23/2023 0843    Lipid Panel     Component Value Date/Time   CHOL 200 (H) 11/23/2023 0843   TRIG 376 (H) 11/23/2023 0843   HDL 62 11/23/2023 0843   CHOLHDL 3.2 11/23/2023 0843   LDLCALC 88 11/23/2023 0843    CBC    Component Value Date/Time   WBC 7.2 11/23/2023 0843   RBC 5.12 (H) 11/23/2023 0843   HGB 14.6 11/23/2023 0843   HCT 43.9 11/23/2023 0843   PLT 299 11/23/2023 0843   MCV 85.7 11/23/2023 0843   MCH 28.5 11/23/2023 0843   MCHC 33.3 11/23/2023 0843   RDW 13.0 11/23/2023 0843    Hgb A1C Lab Results  Component Value Date   HGBA1C 5.9 (H) 11/23/2023           Assessment & Plan:     RTC in 6 months for your annual exam Angeline Laura, NP

## 2024-06-27 DIAGNOSIS — F4310 Post-traumatic stress disorder, unspecified: Secondary | ICD-10-CM | POA: Diagnosis not present

## 2024-06-28 ENCOUNTER — Ambulatory Visit: Admitting: Internal Medicine

## 2024-07-02 NOTE — Progress Notes (Unsigned)
 Referring Physician:  Antonette Angeline ORN, NP [address] [place],  [place] [address]  Primary Physician:  Antonette Angeline ORN, NP  History of Present Illness: Ms. Sara Andrade has a history of HTN, GERD, ADHD, PTSD, FM, obesity, prediabetes, hyperlipidemia, chronic pain, depression, OSA.   Last seen by me on 05/08/24 for constant neck pain into both arms with weakness in hands along with balance and dexterity issues. She has known cervical spondylosis with mild central stenosis C5-C7 and multilevel foraminal stenosis from 2023 MRI.   She also had LBP with right leg pain. MRI from 2024 showed lumbar spondylosis with mild left foraminal stenosis at L4-L5.   She was hyper reflexic at last visit and had dexterity balance issues.   MRI of her cervical/thoracic/lumbar spine ordered and she is here to review them. She was to follow up with GI regarding bowel incontinence.   She is here for follow up.   She is about the same. She continues with constant neck pain into her shoulder blades and upper mid back and into her both arms. She has numbness and tingling in both arms. She notes tremor in right thumb for few weeks. She has weakness in hands and is dropping things, right > left. She notes balance and dexterity issues as well.  No alleviating factors. She is worse when in any prolonged position, lifting.   She has chronic constant LBP with constant right lateral leg pain to her knee. No left leg pain. She has occasional numbness and tingling in her feet. She has weakness in right hip and feels like she drags her right foot at times. Pain is worse with standing, walking, sitting, and lifting.   She also has right hip labral tear- she has seen ortho.   EMG 11/02/2022 showed abnormal study. There is no electrodiagnostic evidence of a large fiber neuropathy in the upper extremities.   She is on neurontin  to 600mg  tid and it is helping. She is on robaxin  at night. She is on prn motrin .   Tobacco use: Does  not smoke.   Bowel/Bladder Dysfunction: she has chronic urinary incontinence. She notes 2-3 month history of some bowel incontinence as well. She has discussed with PCP. No perineal numbness.  Conservative measures:  Physical therapy: has not participated in for her neck pain Multimodal medical therapy including regular antiinflammatories:  Gabapentin , Robaxin , Flexeril  Injections:   no epidural steroid injections  Past Surgery: no spine surgery  Sara Andrade has more recent symptoms of balance and dexterity issues.   The symptoms are causing a significant impact on the patient's life.   Review of Systems:  A 10 point review of systems is negative, except for the pertinent positives and negatives detailed in the HPI.  Past Medical History: Past Medical History:  Diagnosis Date   Agoraphobia with panic disorder 03/21/2020   Have not left the house alone or driven since 2021     Allergy    Anxiety    Complication of anesthesia    Depression    GERD (gastroesophageal reflux disease)    Heart murmur    Hypertension    Neuromuscular disorder (HCC) 2021   Fibromyalgia   Sleep apnea     Past Surgical History: Past Surgical History:  Procedure Laterality Date   ABDOMINAL HYSTERECTOMY     COLONOSCOPY WITH PROPOFOL  N/A 06/01/2023   Procedure: COLONOSCOPY WITH PROPOFOL ;  Surgeon: Therisa Bi, MD;  Location: [facility] ENDOSCOPY;  Service: Gastroenterology;  Laterality: N/A;   POLYPECTOMY  06/01/2023   Procedure: POLYPECTOMY;  Surgeon: Therisa Bi, MD;  Location: [facility] ENDOSCOPY;  Service: Gastroenterology;;    Allergies: Allergies as of 07/04/2024 - Review Complete 07/04/2024  Allergen Reaction Noted   Bee venom Rash 01/21/2021   Escitalopram Other (See Comments) 09/22/2021   Lactose Diarrhea 06/04/2015   Losartan Other (See Comments) 04/16/2024   Prednisone  12/25/2020   Sertraline Diarrhea 08/25/2015   Lisinopril Cough and Other (See Comments) 05/22/2015     Medications: Outpatient Encounter Medications as of 07/04/2024  Medication Sig   cholestyramine  (QUESTRAN ) 4 g packet Take 1 packet (4 g total) by mouth 3 (three) times daily with meals.   dicyclomine  (BENTYL ) 10 MG capsule Take 1 capsule (10 mg total) by mouth 3 (three) times daily before meals.   felodipine  (PLENDIL ) 10 MG 24 hr tablet Take 1 tablet (10 mg total) by mouth daily.   fluvoxaMINE (LUVOX) 100 MG tablet Take 300 mg by mouth at bedtime.   gabapentin  (NEURONTIN ) 600 MG tablet TAKE 1 TABLET BY MOUTH THREE TIMES A DAY   ibuprofen  (ADVIL ) 600 MG tablet TAKE 1 TABLET BY MOUTH EVERY 8 HOURS AS NEEDED   lamoTRIgine (LAMICTAL) 200 MG tablet Take 200 mg by mouth daily.   lovastatin  (MEVACOR ) 20 MG tablet Take 1 tablet (20 mg total) by mouth at bedtime.   methocarbamol  (ROBAXIN ) 500 MG tablet TAKE 1 TABLET (500 MG TOTAL) BY MOUTH AT BEDTIME AS NEEDED.   omeprazole  (PRILOSEC) 20 MG capsule Take 1 capsule (20 mg total) by mouth daily.   prazosin (MINIPRESS) 5 MG capsule Take 5 mg by mouth at bedtime.   REXULTI 4 MG TABS Take 1 tablet by mouth daily.   viloxazine ER (QELBREE) 200 MG 24 hr capsule Take 200 mg by mouth daily.   [DISCONTINUED] dicyclomine  (BENTYL ) 10 MG capsule Take 1 capsule (10 mg total) by mouth 3 (three) times daily before meals. (Patient not taking: Reported on 07/04/2024)   [DISCONTINUED] felodipine  (PLENDIL ) 5 MG 24 hr tablet TAKE 1 TABLET (5 MG TOTAL) BY MOUTH DAILY.   [DISCONTINUED] lamoTRIgine (LAMICTAL) 100 MG tablet Take 200 mg by mouth daily.   [DISCONTINUED] omeprazole  (PRILOSEC) 40 MG capsule TAKE 1 CAPSULE BY MOUTH EVERY DAY BEFORE BREAKFAST   [DISCONTINUED] QELBREE 200 MG 24 hr capsule Take 400 mg by mouth daily.   [DISCONTINUED] REXULTI 2 MG TABS tablet Take 4 mg by mouth daily.   No facility-administered encounter medications on file as of 07/04/2024.    Social History: Social History   Tobacco Use   Smoking status: Former    Current packs/day:  0.00    Average packs/day: 0.3 packs/day for 3.0 years (0.8 ttl pk-yrs)    Types: Cigarettes    Start date: 2009    Quit date: 2012    Years since quitting: 13.9   Smokeless tobacco: Never   Tobacco comments:    Used to socially smoke  Vaping Use   Vaping status: Never Used  Substance Use Topics   Alcohol use: Yes    Comment: occ   Drug use: Never    Family Medical History: Family History  Adopted: Yes    Physical Examination: Vitals:   07/04/24 0955  BP: 128/70      Awake, alert, oriented to person, place, and time.  Speech is clear and fluent. Fund of knowledge is appropriate.   Cranial Nerves: Pupils equal round and reactive to light.  Facial tone is symmetric.    diffuse posterior cervical tenderness. mild tenderness  in bilateral trapezial region.  No abnormal lesions on exposed skin.   She has very large pendulous breasts.   Strength: Side Biceps Triceps Deltoid Interossei Grip Wrist Ext. Wrist Flex.  R 5 5 5 5 5 5 5   L 5 5 5 5 5 5 5    Side Iliopsoas Quads Hamstring PF DF EHL  R 5 5 5 5 5 5   L 5 5 5 5 5 5    Reflexes are 3+ and symmetric at the biceps, brachioradialis, patella and achilles.   Hoffman's is absent.  Clonus is not present.   Bilateral upper and lower extremity sensation is intact to light touch.     She has groin pain with IR/ER of right hip. No pain with IR/ER left hip.   Gait is normal.      Medical Decision Making  Imaging: Cervical MRI dated 06/08/24:  FINDINGS: Alignment: Straightening with mild reversal of the normal cervical lordosis. No significant listhesis.   Vertebrae: Vertebral body height maintained without acute or chronic fracture. Bone marrow signal intensity heterogeneous but overall within normal limits. No worrisome osseous lesions. Degenerative reactive endplate change with marrow edema present about the C5-6 and C6-7 interspaces. No other abnormal marrow edema.   Cord: Normal signal and morphology.    Posterior Fossa, vertebral arteries, paraspinal tissues: Unremarkable.   Disc levels:   C2-C3: Normal interspace. Mild right-sided facet hypertrophy. No stenosis.   C3-C4: Disc bulge with bilateral uncovertebral hypertrophy. Flattening of the ventral thecal sac without significant spinal stenosis. Foramina remain patent. Appearance is mildly progressed.   C4-C5: Mild degenerative intervertebral disc space narrowing with diffuse disc bulge and bilateral uncovertebral spurring. Flattening and partial effacement of the ventral thecal sac without significant spinal stenosis. Foramina remain patent. Appearance is mildly progressed.   C5-C6: Mild degenerative intervertebral disc space narrowing with diffuse disc osteophyte complex, asymmetric to the left. Flattening and partial effacement of the ventral thecal sac with resultant mild spinal stenosis. Mild cord flattening without cord signal changes. Moderate left C6 foraminal stenosis. Right neural foramen remains patent. Changes are mildly progressed.   C6-C7: Degenerative intervertebral disc space narrowing with diffuse disc osteophyte complex. Posterior component flattens and partially faces the ventral thecal sac. Superimposed small central disc extrusion with inferior migration noted. Mild spinal stenosis. Severe right with moderate left C7 foraminal stenosis. Changes are mildly progressed.   C7-T1: Negative interspace. Mild left-sided facet hypertrophy. No canal or foraminal stenosis.   IMPRESSION: 1. Multilevel cervical spondylosis with resultant mild spinal stenosis at C5-6 and C6-7. 2. Multifactorial degenerative changes with resultant multilevel foraminal narrowing as above. Notable findings include moderate left C6 foraminal stenosis, with severe right and moderate left C7 foraminal narrowing. 3. Degenerative reactive endplate change with marrow edema about the C5-6 and C6-7 interspaces. Finding could contribute to  underlying neck pain.     Electronically Signed   By: Morene Hoard M.D.   On: 06/11/2024 17:30    Thoracic MRI dated 06/08/24:  FINDINGS: Alignment: Mild dextroscoliosis. Alignment otherwise normal with preservation of the normal thoracic kyphosis. No listhesis.   Vertebrae: Vertebral body height maintained without acute or chronic fracture. Bone marrow signal intensity diffusely heterogeneous but overall within normal limits. Few benign hemangiomata noted within the T5 and T10 vertebral bodies. No worrisome osseous lesions. No abnormal marrow edema.   Cord:  Normal signal and morphology.   Paraspinal and other soft tissues: Unremarkable.   Disc levels:   T1-2:  Unremarkable.   T2-3: Normal interspace. Mild  right-sided facet hypertrophy. No canal or foraminal stenosis.   T3-4:  Normal interspace.  No canal or foraminal stenosis.   T4-5: Normal interspace. Mild bilateral facet hypertrophy. No stenosis.   T5-6: Normal interspace. Moderate left with mild right facet hypertrophy. No significant spinal stenosis. Foramina remain patent.   T6-7: Normal interspace. Mild bilateral facet hypertrophy. No stenosis.   T7-8: Mild disc bulge. Mild facet spurring. No canal or foraminal stenosis.   T8-9: Minimal disc bulge. Mild bilateral facet hypertrophy. No significant canal or foraminal stenosis.   T9-10: Normal interspace. Mild bilateral facet hypertrophy. No significant canal or foraminal stenosis.   T10-11: Mild disc bulge with endplate spurring. Mild facet degeneration. No canal or foraminal stenosis.   T11-12: Minimal disc bulge. Mild bilateral facet spurring. No canal or foraminal stenosis.   T12-L1:  Minimal disc bulge.  No canal or foraminal stenosis.   IMPRESSION: 1. Mild multilevel thoracic spondylosis without significant stenosis or neural impingement. 2. Mild to moderate multilevel facet hypertrophy throughout the thoracic spine, most pronounced  at T5-6 on the left. Finding could contribute to back pain. 3. Normal MRI appearance of the thoracic spinal cord.     Electronically Signed   By: Morene Hoard M.D.   On: 06/11/2024 17:48     Lumbar MRI dated 06/08/24:  FINDINGS: Segmentation: Standard. Lowest well-formed disc space labeled the L5-S1 level.   Alignment: Mild dextroscoliosis with straightening of the normal lumbar lordosis. No listhesis.   Vertebrae: Vertebral body height maintained without acute or chronic fracture. Bone marrow signal intensity heterogeneous but overall within normal limits. No worrisome osseous lesions. Mild r degenerative reactive endplate changes present about the L4-5 interspace. No other abnormal marrow edema.   Conus medullaris and cauda equina: Conus extends to the L1 level. Conus and cauda equina appear normal.   Paraspinal and other soft tissues: Unremarkable.   Disc levels:   L1-2:  Unremarkable.   L2-3: Disc desiccation with mild annular disc bulge. No spinal stenosis. Foramina remain patent.   L3-4: Disc desiccation with mild annular disc bulge. No spinal stenosis. Foramina remain patent.   L4-5: Mild degenerative intervertebral disc space narrowing with disc desiccation and diffuse disc bulge. Superimposed small right foraminal disc protrusion contacts the transiting right L4 nerve root (series 114, image 29). No significant spinal stenosis. Moderate bilateral L4 foraminal narrowing.   L5-S1: Disc desiccation without significant disc bulge. Mild right greater than left facet hypertrophy. No spinal stenosis. Foramina remain patent.   IMPRESSION: 1. Small right foraminal disc protrusion at L4-5, contacting and potentially affecting the transiting right L4 nerve root. 2. Mild degenerative disc bulging at L2-3 and L3-4 without stenosis or impingement. 3. Mild right greater than left facet hypertrophy at L5-S1 without stenosis.     Electronically Signed   By:  Morene Hoard M.D.   On: 06/11/2024 18:04     I have personally reviewed the images and agree with the above interpretation.  Assessment and Plan: Ms. Sara Andrade has constant neck pain into her shoulder blades and upper mid back and into her both arms. She has numbness and tingling in both arms. She has weakness in hands and is dropping things, right > left.  She has cervical spondylosis with mild central stenosis C5-C7, moderate left foraminal stenosis C5-C6, and severe right/moderate left foraminal stenosis C6-C7.   No central or foraminal stenosis in thoracic spine. She has diffuse thoracic spondylosis.    She has chronic constant LBP with constant right lateral leg pain  to her knee. No left leg pain. She has occasional numbness and tingling in her feet. She has weakness in right hip and feels like she drags her right foot at times.   She has lumbar DDD and spondylosis. She has right foraminal disc L4-L5 with moderate bilateral foraminal stenosis and likely contact with right L4 nerve.   She has pain in right groin with IR/ER of right hip- this pain is likely hip mediated.   Treatment options discussed with patient and following plan made:   - Recommend PT for cervical and lumbar spine. She has agoraphobia and this is not feasible at this time. Would be difficult to do HHPT as she has 4 dogs.  - Referral to PMR at [facility] to discuss possible cervical and lumbar injections.  - Of note, she has very large pendulous breasts. She is unable to wear bra due to shoulder pain. This is contributing to her neck and back pain. Recommend she consider breast reduction.  - She will continue to follow with ortho for her right hip pain.  - If no improvement with above, may need to revisit PT.  - She will follow up with me in 8-10 weeks and prn.   I spent a total of 35 minutes in face-to-face and non-face-to-face activities related to this patient's care today including review of outside records, review  of imaging, review of symptoms, physical exam, discussion of differential diagnosis, discussion of treatment options, and documentation.   Glade Boys PA-C Dept. of Neurosurgery

## 2024-07-03 NOTE — Progress Notes (Unsigned)
 Subjective:    Patient ID: Sara Andrade, female    DOB: 06-07-1981, 43 y.o.   MRN: [id number]  HPI  Patient presents the clinic today for follow-up of chronic conditions.  HTN: Her BP today is 148/108. Her BP at home runs 135/87. She is taking felodipine  as prescribed.  There is no ECG on file.  ADHD: She reports mainly innattention.  She is taking qelbree as prescribed.  She follows with psychiatry.  Anxiety, depression and PTSD: Chronic, managed on lamotrigine, prazosin, fluvoxamine and rexulti.  She is no longer taking diazepam or clonazepam.  She is currently seeing a therapist and a psychiatrist.  She denies SI/HI.  Fibromyalgia: She reports chronic joint and muscle pain, worse in her back. She is taking gabapentin  and methocarbamol  with some relief of symptoms.  She follows with physiatry.  Chronic diarrhea: She is not currently taking any medications for this.  She has been prescribed dicyclomine  in the past.  She follows with GI.  GERD: Triggered by spicy and greasy foods.  She denies breakthrough on omeprazole .  There is no upper GI on file.  Prediabetes: Her last A1c was 5.9%, 11/2023.  She is not taking any oral diabetic medication at this time.  She does not check her sugars.  HLD: Her last LDL was 88, triglycerides 623, 11/2023.  She denies myalgias on lovastatin .  She does not consume a low-fat diet.  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 90 capsule 5    felodipine  (PLENDIL ) 5 MG 24 hr tablet TAKE 1 TABLET (5 MG TOTAL) BY MOUTH DAILY. 90 tablet 0   fluvoxaMINE (LUVOX) 100 MG tablet Take 300 mg by mouth at bedtime.     gabapentin  (NEURONTIN ) 600 MG tablet TAKE 1 TABLET BY MOUTH THREE TIMES A DAY 90 tablet 1   ibuprofen  (ADVIL ) 600 MG tablet TAKE 1 TABLET BY MOUTH EVERY 8 HOURS AS NEEDED 90 tablet 0   lamoTRIgine (LAMICTAL) 100 MG tablet Take 200 mg by mouth daily.     lovastatin  (MEVACOR ) 20 MG tablet Take 1 tablet (20 mg total) by mouth at bedtime. 90 tablet 1   methocarbamol  (ROBAXIN ) 500 MG tablet TAKE 1 TABLET (500 MG TOTAL) BY MOUTH AT BEDTIME AS NEEDED. 30 tablet 2   omeprazole  (PRILOSEC) 40 MG capsule TAKE 1 CAPSULE BY MOUTH EVERY DAY BEFORE BREAKFAST 30 capsule 2   prazosin (MINIPRESS) 5 MG capsule Take 5 mg by mouth at bedtime.     QELBREE 200 MG 24 hr capsule Take 400 mg by mouth daily.     REXULTI 2 MG TABS tablet Take 4 mg by mouth daily.     No current facility-administered medications for this visit.    Allergies  Allergen Reactions   Bee Venom Rash   Escitalopram Other (See Comments)   Lactose Diarrhea    Gas    Losartan Other (  See Comments)   Prednisone     Other reaction(s): Unknown   Sertraline Diarrhea    Other reaction(s): Other (See Comments), Other (See Comments) Emotional numbness Emotional numbness    Lisinopril Cough and Other (See Comments)    cough cough     Family History  Adopted: Yes    Social History   Socioeconomic History   Marital status: Married    Spouse name: Not on file   Number of children: Not on file   Years of education: Not on file   Highest education level: Associate degree: occupational, scientist, product/process development, or vocational program  Occupational History   Not on file  Tobacco Use   Smoking status: Former    Current packs/day: 0.00    Average packs/day: 0.3 packs/day for 3.0 years (0.8 ttl pk-yrs)    Types: Cigarettes    Start date: 2009    Quit date: 2012    Years since  quitting: 13.9   Smokeless tobacco: Never   Tobacco comments:    Used to socially smoke  Vaping Use   Vaping status: Never Used  Substance and Sexual Activity   Alcohol use: Yes    Comment: occ   Drug use: Never   Sexual activity: Yes    Comment: Im gay so nah  Other Topics Concern   Not on file  Social History Narrative   Not on file   Social Drivers of Health   Tobacco Use: Medium Risk (05/08/2024)   [facility] from [facility]   Patient History    Smoking Tobacco Use: Former    Smokeless Tobacco Use: Never    Passive Exposure: Not on file  Financial Resource Strain: High Risk (06/30/2024)   Overall Financial Resource Strain (CARDIA)    Difficulty of Paying Living Expenses: Very hard  Food Insecurity: Patient Declined (06/30/2024)   Epic    Worried About Programme Researcher, Broadcasting/film/video in the Last Year: Patient declined    Barista in the Last Year: Patient declined  Transportation Needs: Unmet Transportation Needs (06/30/2024)   Epic    Lack of Transportation (Medical): Yes    Lack of Transportation (Non-Medical): Yes  Physical Activity: Inactive (06/30/2024)   Exercise Vital Sign    Days of Exercise per Week: 0 days    Minutes of Exercise per Session: Not on file  Stress: Stress Concern Present (06/30/2024)   [employer] of Occupational Health - Occupational Stress Questionnaire    Feeling of Stress: Very much  Social Connections: Socially Isolated (06/30/2024)   Social Connection and Isolation Panel    Frequency of Communication with Friends and Family: Never    Frequency of Social Gatherings with Friends and Family: Never    Attends Religious Services: Never    Database Administrator or Organizations: No    Attends Engineer, Structural: Not on file    Marital Status: Married  Catering Manager Violence: Not on file  Depression (PHQ2-9): High Risk (11/23/2023)   Depression (PHQ2-9)    PHQ-2 Score: 21  Alcohol Screen: Low Risk  (06/30/2024)   Alcohol Screen    Last Alcohol Screening Score (AUDIT): 1  Housing: Unknown (06/30/2024)   Epic    Unable to Pay for Housing in the Last Year: Patient declined    Number of Times Moved in the Last Year: Not on file    Homeless in the Last Year: No  Utilities: Not on file  Health Literacy: Not on file  Constitutional: Denies fever, malaise, fatigue, headache or abrupt weight changes.  HEENT: Denies eye pain, eye redness, ear pain, ringing in the ears, wax buildup, runny nose, nasal congestion, bloody nose, or sore throat. Respiratory: Denies difficulty breathing, shortness of breath, cough or sputum production.   Cardiovascular: Denies chest pain, chest tightness, palpitations or swelling in the hands or feet.  Gastrointestinal: Patient reports intermittent reflux, chronic diarrhea.  Denies abdominal pain, bloating, constipation, or blood in the stool.  GU: Denies urgency, frequency, pain with urination, burning sensation, blood in urine, odor or discharge. Musculoskeletal: Patient reports chronic joint and muscle pain, worse in her neck and back.  Denies decrease in range of motion, difficulty with gait, or joint swelling.  Skin: Denies redness, rashes, lesions or ulcercations.  Neurological: Patient reports inattention, paresthesia of upper and lower extremities.  Denies dizziness, difficulty with memory, difficulty with speech or problems with balance and coordination.  Psych: Patient has a history of anxiety and depression.  Denies SI/HI.  No other specific complaints in a complete review of systems (except as listed in HPI above).  Objective:   Physical Exam   There were no vitals taken for this visit.  Wt Readings from Last 3 Encounters:  05/08/24 206 lb (93.4 kg)  11/23/23 190 lb 12.8 oz (86.5 kg)  07/03/23 195 lb 9.6 oz (88.7 kg)    General: Appears her stated age, obese, in NAD. Skin: Warm, dry and intact.  Indentions noted in her shoulders from  bra. HEENT: Head: normal shape and size; Eyes: sclera white, no icterus, conjunctiva pink, PERRLA and EOMs intact;  Cardiovascular: Normal rate and rhythm. S1,S2 noted.  No murmur, rubs or gallops noted. No JVD or BLE edema.  Pulmonary/Chest: Normal effort and positive vesicular breath sounds. No respiratory distress. No wheezes, rales or ronchi noted.  Abdomen: Soft and nontender.  Hyperactive bowel sounds.  Musculoskeletal: Bony tenderness noted throughout the cervical, thoracic and lumbar spine pain with palpation of bilateral paralumbar muscles.  Strength 5/5 BUE, 4/5 BLE.  No difficulty with gait.  Neurological: Alert and oriented. Cranial nerves II-XII grossly intact. Coordination normal.  Psychiatric: Mood and affect normal.  Very anxious appearing. Judgment and thought content normal.    BMET    Component Value Date/Time   NA 142 11/23/2023 0843   K 5.0 11/23/2023 0843   CL 107 11/23/2023 0843   CO2 30 11/23/2023 0843   GLUCOSE 68 11/23/2023 0843   BUN 16 11/23/2023 0843   CREATININE 1.04 (H) 11/23/2023 0843   CALCIUM 9.2 11/23/2023 0843    Lipid Panel     Component Value Date/Time   CHOL 200 (H) 11/23/2023 0843   TRIG 376 (H) 11/23/2023 0843   HDL 62 11/23/2023 0843   CHOLHDL 3.2 11/23/2023 0843   LDLCALC 88 11/23/2023 0843    CBC    Component Value Date/Time   WBC 7.2 11/23/2023 0843   RBC 5.12 (H) 11/23/2023 0843   HGB 14.6 11/23/2023 0843   HCT 43.9 11/23/2023 0843   PLT 299 11/23/2023 0843   MCV 85.7 11/23/2023 0843   MCH 28.5 11/23/2023 0843   MCHC 33.3 11/23/2023 0843   RDW 13.0 11/23/2023 0843    Hgb A1C Lab Results  Component Value Date   HGBA1C 5.9 (H) 11/23/2023           Assessment & Plan:     RTC in 6 months for your annual exam Angeline Laura, NP

## 2024-07-04 ENCOUNTER — Ambulatory Visit: Admitting: Internal Medicine

## 2024-07-04 ENCOUNTER — Encounter: Payer: Self-pay | Admitting: Internal Medicine

## 2024-07-04 ENCOUNTER — Ambulatory Visit: Admitting: Orthopedic Surgery

## 2024-07-04 ENCOUNTER — Encounter: Payer: Self-pay | Admitting: Orthopedic Surgery

## 2024-07-04 VITALS — BP 128/70 | Ht 62.0 in | Wt 202.0 lb

## 2024-07-04 VITALS — BP 136/94 | Ht 62.0 in | Wt 206.8 lb

## 2024-07-04 DIAGNOSIS — E782 Mixed hyperlipidemia: Secondary | ICD-10-CM

## 2024-07-04 DIAGNOSIS — M47816 Spondylosis without myelopathy or radiculopathy, lumbar region: Secondary | ICD-10-CM

## 2024-07-04 DIAGNOSIS — M797 Fibromyalgia: Secondary | ICD-10-CM

## 2024-07-04 DIAGNOSIS — M4722 Other spondylosis with radiculopathy, cervical region: Secondary | ICD-10-CM

## 2024-07-04 DIAGNOSIS — M5412 Radiculopathy, cervical region: Secondary | ICD-10-CM

## 2024-07-04 DIAGNOSIS — F32A Depression, unspecified: Secondary | ICD-10-CM | POA: Diagnosis not present

## 2024-07-04 DIAGNOSIS — F419 Anxiety disorder, unspecified: Secondary | ICD-10-CM | POA: Diagnosis not present

## 2024-07-04 DIAGNOSIS — R7303 Prediabetes: Secondary | ICD-10-CM

## 2024-07-04 DIAGNOSIS — M545 Low back pain, unspecified: Secondary | ICD-10-CM

## 2024-07-04 DIAGNOSIS — F900 Attention-deficit hyperactivity disorder, predominantly inattentive type: Secondary | ICD-10-CM

## 2024-07-04 DIAGNOSIS — K580 Irritable bowel syndrome with diarrhea: Secondary | ICD-10-CM

## 2024-07-04 DIAGNOSIS — F331 Major depressive disorder, recurrent, moderate: Secondary | ICD-10-CM | POA: Insufficient documentation

## 2024-07-04 DIAGNOSIS — M4802 Spinal stenosis, cervical region: Secondary | ICD-10-CM | POA: Diagnosis not present

## 2024-07-04 DIAGNOSIS — G8929 Other chronic pain: Secondary | ICD-10-CM

## 2024-07-04 DIAGNOSIS — M4726 Other spondylosis with radiculopathy, lumbar region: Secondary | ICD-10-CM

## 2024-07-04 DIAGNOSIS — F4310 Post-traumatic stress disorder, unspecified: Secondary | ICD-10-CM

## 2024-07-04 DIAGNOSIS — M47812 Spondylosis without myelopathy or radiculopathy, cervical region: Secondary | ICD-10-CM

## 2024-07-04 DIAGNOSIS — F5104 Psychophysiologic insomnia: Secondary | ICD-10-CM

## 2024-07-04 DIAGNOSIS — M51362 Other intervertebral disc degeneration, lumbar region with discogenic back pain and lower extremity pain: Secondary | ICD-10-CM | POA: Diagnosis not present

## 2024-07-04 DIAGNOSIS — F411 Generalized anxiety disorder: Secondary | ICD-10-CM

## 2024-07-04 DIAGNOSIS — Z6837 Body mass index (BMI) 37.0-37.9, adult: Secondary | ICD-10-CM

## 2024-07-04 DIAGNOSIS — I10 Essential (primary) hypertension: Secondary | ICD-10-CM | POA: Diagnosis not present

## 2024-07-04 DIAGNOSIS — M5416 Radiculopathy, lumbar region: Secondary | ICD-10-CM

## 2024-07-04 DIAGNOSIS — Z23 Encounter for immunization: Secondary | ICD-10-CM | POA: Diagnosis not present

## 2024-07-04 DIAGNOSIS — K219 Gastro-esophageal reflux disease without esophagitis: Secondary | ICD-10-CM

## 2024-07-04 MED ORDER — DICYCLOMINE HCL 10 MG PO CAPS: 10.0000 mg | ORAL_CAPSULE | Freq: Three times a day (TID) | ORAL | 1 refills | Status: AC

## 2024-07-04 MED ORDER — OMEPRAZOLE 20 MG PO CPDR: 20.0000 mg | DELAYED_RELEASE_CAPSULE | Freq: Every day | ORAL | 1 refills | Status: AC

## 2024-07-04 MED ORDER — FELODIPINE ER 10 MG PO TB24: 10.0000 mg | ORAL_TABLET | Freq: Every day | ORAL | 1 refills | Status: AC

## 2024-07-04 MED ORDER — CHOLESTYRAMINE 4 G PO PACK: 4.0000 g | PACK | Freq: Three times a day (TID) | ORAL | 1 refills | Status: AC

## 2024-07-04 NOTE — Assessment & Plan Note (Signed)
 Complicated by obesity Avoid foods that trigger reflux Encourage weight loss as this can help reduce reflux symptoms Continue omeprazole  20 mg daily

## 2024-07-04 NOTE — Assessment & Plan Note (Signed)
 Continue prazosin 5 mg at bedtime She will continue to follow with psychiatry

## 2024-07-04 NOTE — Assessment & Plan Note (Signed)
 Continue lamotrigine 200 mg daily, prazosin 5 mg nightly, fluvoxamine 300 mg nightly and rexulti 4 mg daily  She will continue to follow with psychiatry

## 2024-07-04 NOTE — Patient Instructions (Signed)

## 2024-07-04 NOTE — Patient Instructions (Signed)
 It was so nice to see you today. Thank you so much for coming in.    You have wear and tear in your neck. Mild pressure on spinal cord at C5-C7 with some irritation of nerves on right and left.   Mid back shows wear and tear. No compression of spinal cord or nerves.   Lower back shows disc on the right at L4-L5 that is irritating that nerve on right.   I want you to see physical medicine and rehab at the [facility] to discuss possible injections in your neck and back. Dr. Avanell, Dr. Dodson, and their NP [place] are great and will take good care of you. They should call you to schedule an appointment or you can call them at [phone].   I agree with consideration of breast reduction.   We can always revisit PT if needed.   I will see you back in 8-10 weeks. Please do not hesitate to call if you have any questions or concerns. You can also message me in MyChart.   Glade Boys PA-C [phone]     The physicians and staff at [facility] are committed to providing excellent care. You may receive a survey asking for feedback about your experience at our office. We value you your feedback and appreciate you taking the time to to fill it out. The [facility] leadership team is also available to discuss your experience in person, feel free to contact us  [phone].

## 2024-07-04 NOTE — Assessment & Plan Note (Signed)
 Encouraged diet and exercise for weight loss ?

## 2024-07-04 NOTE — Assessment & Plan Note (Signed)
 Complicated by obesity Elevated today and she reports elevated readings at home Will increase felodipine  to 10 mg daily Reinforced DASH diet and exercise for weight loss C-Met today

## 2024-07-04 NOTE — Assessment & Plan Note (Signed)
 Complicated by obesity and large breast Continue gabapentin  600 mg 3 times daily and methocarbamol  500 mg nightly as needed Encouraged regular stretching She will continue to follow with physiatry

## 2024-07-04 NOTE — Assessment & Plan Note (Signed)
 Complicated by obesity A1c today Encouraged low-carb diet and exercise for weight loss

## 2024-07-04 NOTE — Assessment & Plan Note (Signed)
 Continue qelbree 200 mg daily She is unable to tolerate 400 mg She will continue to follow with psychiatry

## 2024-07-04 NOTE — Assessment & Plan Note (Signed)
 Continue dicyclomine  10 mg 3 times daily as needed Will add cholestyramine  4 mg 3 times daily with meals Encouraged high-fiber diet and adequate water intake

## 2024-07-04 NOTE — Assessment & Plan Note (Addendum)
 Complicated by obesity C-Met and lipid profile today Encouraged her to consume a low-fat diet Continue lovastatin  20 mg daily, will adjust if needed to obtain LDL <100 and triglycerides < 150

## 2024-07-05 ENCOUNTER — Ambulatory Visit: Payer: Self-pay | Admitting: Internal Medicine

## 2024-07-05 LAB — CBC
HCT: 43.9 % (ref 35.9–46.0)
Hemoglobin: 14.3 g/dL (ref 11.7–15.5)
MCH: 27.9 pg (ref 27.0–33.0)
MCHC: 32.6 g/dL (ref 31.6–35.4)
MCV: 85.7 fL (ref 81.4–101.7)
MPV: 10.8 fL (ref 7.5–12.5)
Platelets: 263 Thousand/uL (ref 140–400)
RBC: 5.12 Million/uL — ABNORMAL HIGH (ref 3.80–5.10)
RDW: 13.1 % (ref 11.0–15.0)
WBC: 6.1 Thousand/uL (ref 3.8–10.8)

## 2024-07-05 LAB — COMPREHENSIVE METABOLIC PANEL WITH GFR
AG Ratio: 2 (calc) (ref 1.0–2.5)
ALT: 22 U/L (ref 6–29)
AST: 17 U/L (ref 10–35)
Albumin: 4.3 g/dL (ref 3.6–5.1)
Alkaline phosphatase (APISO): 85 U/L (ref 31–125)
BUN: 12 mg/dL (ref 7–25)
CO2: 29 mmol/L (ref 20–32)
Calcium: 9.2 mg/dL (ref 8.6–10.2)
Chloride: 102 mmol/L (ref 98–110)
Creat: 0.99 mg/dL (ref 0.50–0.99)
Globulin: 2.2 g/dL (ref 1.9–3.7)
Glucose, Bld: 105 mg/dL — ABNORMAL HIGH (ref 65–99)
Potassium: 4.2 mmol/L (ref 3.5–5.3)
Sodium: 140 mmol/L (ref 135–146)
Total Bilirubin: 0.2 mg/dL (ref 0.2–1.2)
Total Protein: 6.5 g/dL (ref 6.1–8.1)
eGFR: 72 mL/min/1.73m2

## 2024-07-05 LAB — LIPID PANEL
Cholesterol: 212 mg/dL — ABNORMAL HIGH
HDL: 71 mg/dL
LDL Cholesterol (Calc): 113 mg/dL — ABNORMAL HIGH
Non-HDL Cholesterol (Calc): 141 mg/dL — ABNORMAL HIGH
Total CHOL/HDL Ratio: 3 (calc)
Triglycerides: 165 mg/dL — ABNORMAL HIGH

## 2024-07-05 LAB — HEMOGLOBIN A1C
Hgb A1c MFr Bld: 5.9 % — ABNORMAL HIGH
Mean Plasma Glucose: 123 mg/dL
eAG (mmol/L): 6.8 mmol/L

## 2024-07-05 LAB — VITAMIN D 25 HYDROXY (VIT D DEFICIENCY, FRACTURES): Vit D, 25-Hydroxy: 31 ng/mL (ref 30–100)

## 2024-07-05 MED ORDER — LOVASTATIN 40 MG PO TABS: 40.0000 mg | ORAL_TABLET | Freq: Every day | ORAL | 3 refills | Status: AC

## 2024-07-10 ENCOUNTER — Other Ambulatory Visit: Payer: Self-pay | Admitting: Internal Medicine

## 2024-07-12 NOTE — Telephone Encounter (Signed)
 Requested by interface surescripts. Medication dose discontinued 03/15/24.  Requested Prescriptions  Refused Prescriptions Disp Refills   gabapentin  (NEURONTIN ) 300 MG capsule [Pharmacy Med Name: GABAPENTIN  300 MG CAPSULE] 90 capsule 5    Sig: TAKE 1 CAPSULE BY MOUTH THREE TIMES A DAY     Neurology: Anticonvulsants - gabapentin  Passed - 07/12/2024  7:40 AM      Passed - Cr in normal range and within 360 days    Creat  Date Value Ref Range Status  07/04/2024 0.99 0.50 - 0.99 mg/dL Final         Passed - Completed PHQ-2 or PHQ-9 in the last 360 days      Passed - Valid encounter within last 12 months    Recent Outpatient Visits           1 week ago Prediabetes   [place] [facility] [place], Angeline ORN, NP   3 months ago Cervical radiculitis   [place] [facility] [place], Angeline ORN, NP   7 months ago Encounter for general adult medical examination with abnormal findings   [place] [facility] [place], Angeline ORN, NP

## 2024-07-15 DIAGNOSIS — F4310 Post-traumatic stress disorder, unspecified: Secondary | ICD-10-CM | POA: Diagnosis not present

## 2024-07-26 ENCOUNTER — Ambulatory Visit: Admitting: Internal Medicine

## 2024-08-06 ENCOUNTER — Other Ambulatory Visit: Payer: Self-pay | Admitting: Family Medicine

## 2024-08-06 DIAGNOSIS — M5412 Radiculopathy, cervical region: Secondary | ICD-10-CM

## 2024-08-08 ENCOUNTER — Other Ambulatory Visit: Payer: Self-pay | Admitting: Internal Medicine

## 2024-08-08 NOTE — Telephone Encounter (Signed)
 Requested Prescriptions  Pending Prescriptions Disp Refills   gabapentin  (NEURONTIN ) 600 MG tablet [Pharmacy Med Name: GABAPENTIN  600 MG TABLET] 270 tablet 1    Sig: TAKE 1 TABLET BY MOUTH THREE TIMES A DAY     Neurology: Anticonvulsants - gabapentin  Passed - 08/08/2024 12:32 PM      Passed - Cr in normal range and within 360 days    Creat  Date Value Ref Range Status  07/04/2024 0.99 0.50 - 0.99 mg/dL Final         Passed - Completed PHQ-2 or PHQ-9 in the last 360 days      Passed - Valid encounter within last 12 months    Recent Outpatient Visits           1 month ago Prediabetes   [place] [facility] [place], Angeline ORN, NP   4 months ago Cervical radiculitis   [place] [facility] [place], Angeline ORN, NP   8 months ago Encounter for general adult medical examination with abnormal findings   [place] [facility] [place], Angeline ORN, NP

## 2024-08-09 ENCOUNTER — Other Ambulatory Visit: Payer: Self-pay | Admitting: Internal Medicine

## 2024-08-09 NOTE — Telephone Encounter (Signed)
 Requested medications are due for refill today.  See note from pharmacy  Requested medications are on the active medications list.  yes  Last refill. 07/04/2024 270 1 rf  Future visit scheduled.   yes  Notes to clinic.  Pharmacy comment: Alternative Requested:NOT COVERED BY NEW INSURANCE, PLEASE SEND AN ALTERNATIVE.     Requested Prescriptions  Pending Prescriptions Disp Refills   cholestyramine  (QUESTRAN ) 4 g packet [Pharmacy Med Name: CHOLESTYRAMINE  PACKET] 270 packet 1    Sig: Take 1 packet (4 g total) by mouth 3 (three) times daily with meals.     Cardiovascular:  Antilipid - Bile Acid Sequestrants Failed - 08/09/2024 12:04 PM      Failed - Lipid Panel in normal range within the last 12 months    Cholesterol  Date Value Ref Range Status  07/04/2024 212 (H) <200 mg/dL Final   LDL Cholesterol (Calc)  Date Value Ref Range Status  07/04/2024 113 (H) mg/dL (calc) Final    Comment:    Reference range: <100 . Desirable range <100 mg/dL for primary prevention;   <70 mg/dL for patients with CHD or diabetic patients  with > or = 2 CHD risk factors. SABRA LDL-C is now calculated using the Martin-Hopkins  calculation, which is a validated novel method providing  better accuracy than the Friedewald equation in the  estimation of LDL-C.  Gladis APPLETHWAITE et al. SANDREA. 7986;689(80): 2061-2068  (http://education.[employer].com/faq/FAQ164)    HDL  Date Value Ref Range Status  07/04/2024 71 > OR = 50 mg/dL Final   Triglycerides  Date Value Ref Range Status  07/04/2024 165 (H) <150 mg/dL Final         Passed - Valid encounter within last 12 months    Recent Outpatient Visits           1 month ago Prediabetes   [place] [facility] [place], Angeline ORN, NP   4 months ago Cervical radiculitis   [place] [facility] [place], Angeline ORN, NP   8 months ago Encounter for general adult medical examination with abnormal findings   [place] [facility] [place], Angeline ORN, NP

## 2024-09-23 ENCOUNTER — Ambulatory Visit: Admitting: Orthopedic Surgery

## 2025-01-03 ENCOUNTER — Encounter: Admitting: Internal Medicine
